# Patient Record
Sex: Female | Born: 1975 | Race: Black or African American | Hispanic: No | Marital: Single | Smoking: Never smoker
Health system: Southern US, Community
[De-identification: ages and names within clinical notes are randomized; demographics above are authoritative.]

## PROBLEM LIST (undated history)

## (undated) DIAGNOSIS — D219 Benign neoplasm of connective and other soft tissue, unspecified: Secondary | ICD-10-CM

## (undated) DIAGNOSIS — K08409 Partial loss of teeth, unspecified cause, unspecified class: Secondary | ICD-10-CM

## (undated) HISTORY — DX: Benign neoplasm of connective and other soft tissue, unspecified: D21.9

## (undated) HISTORY — DX: Partial loss of teeth, unspecified cause, unspecified class: K08.409

---

## 2013-08-07 ENCOUNTER — Emergency Department (HOSPITAL_COMMUNITY)
Admission: EM | Admit: 2013-08-07 | Discharge: 2013-08-07 | Disposition: A | Discharge status: Home / Self Care | Payer: Self-pay | Attending: Emergency Medicine | Admitting: Emergency Medicine

## 2013-08-07 ENCOUNTER — Encounter (HOSPITAL_COMMUNITY): Payer: Self-pay | Admitting: Emergency Medicine

## 2013-08-07 DIAGNOSIS — R51 Headache: Secondary | ICD-10-CM | POA: Insufficient documentation

## 2013-08-07 DIAGNOSIS — H6693 Otitis media, unspecified, bilateral: Secondary | ICD-10-CM

## 2013-08-07 DIAGNOSIS — R05 Cough: Secondary | ICD-10-CM | POA: Insufficient documentation

## 2013-08-07 DIAGNOSIS — R059 Cough, unspecified: Secondary | ICD-10-CM | POA: Insufficient documentation

## 2013-08-07 DIAGNOSIS — H6690 Otitis media, unspecified, unspecified ear: Secondary | ICD-10-CM | POA: Insufficient documentation

## 2013-08-07 MED ORDER — AMOXICILLIN 500 MG PO CAPS
500.0000 mg | ORAL_CAPSULE | Freq: Three times a day (TID) | ORAL | Status: DC
Start: 2013-08-07 — End: 2013-08-24

## 2013-08-07 MED ORDER — ANTIPYRINE-BENZOCAINE 5.4-1.4 % OT SOLN
3.0000 [drp] | OTIC | Status: DC | PRN
  Administered 2013-08-07: 3 [drp] via OTIC
  Filled 2013-08-07: qty 10

## 2013-08-07 NOTE — ED Provider Notes (Signed)
CSN: [id number]     Arrival date & time 08/07/13  1904 History  This chart was scribed for non-physician practitioner, Wille Glaser, PA-C,working with Tanna Furry, MD, by Marlowe Kays, ED Scribe.  This patient was seen in room WTR7/WTR7 and the patient's care was started at 7:32 PM.  Chief Complaint  Patient presents with  . Otalgia   The history is provided by the patient. No language interpreter was used.   HPI Comments:  Christine Lara is a 38 y.o. female who presents to the Emergency Department complaining of flu-like symptoms for approximately 6 days. She reports associated fever, chills, rhinorrhea, mildly productive cough, headache, and right ear otalgia. She states it feels like there may be water in her ear. She states she feels like her flu symptoms were resolving, but states she now feels like she has a cold. She denies body aches, neck stiffness or pain, facial pain and sore throat.  History reviewed. No pertinent past medical history. History reviewed. No pertinent past surgical history. Family History  Problem Relation Age of Onset  . Diabetes Other    History  Substance Use Topics  . Smoking status: Never Smoker   . Smokeless tobacco: Not on file  . Alcohol Use: No   OB History   Grav Para Term Preterm Abortions TAB SAB Ect Mult Living                 Review of Systems  Constitutional: Positive for fever and chills.  HENT: Positive for ear pain (bilateral) and rhinorrhea. Negative for facial swelling and sore throat.   Respiratory: Positive for cough.   Neurological: Positive for headaches.  All other systems reviewed and are negative.    Allergies  Review of patient's allergies indicates no known allergies.  Home Medications   Current Outpatient Rx  Name  Route  Sig  Dispense  Refill  . naproxen (NAPROSYN) 250 MG tablet   Oral   Take 250 mg by mouth 2 (two) times daily as needed (pain).          Triage Vitals: BP 130/83  Pulse 99  Temp(Src)  100.4 F (38 C) (Oral)  Resp 16  Ht 5\' 4"  (1.626 m)  Wt 125 lb (56.7 kg)  BMI 21.45 kg/m2  SpO2 99%  LMP 07/14/2013 Physical Exam  Nursing note and vitals reviewed. Constitutional: She is oriented to person, place, and time. She appears well-developed and well-nourished. No distress.  HENT:  Head: Normocephalic and atraumatic.  Right Ear: Hearing, external ear and ear canal normal. Tympanic membrane is erythematous and bulging.  Left Ear: Hearing and ear canal normal. Tympanic membrane is erythematous and bulging.  Nose: Nose normal.  Mouth/Throat: Oropharynx is clear and moist. No oropharyngeal exudate.  Eyes: Conjunctivae are normal. Pupils are equal, round, and reactive to light. No scleral icterus.  Neck: Normal range of motion. Neck supple.  Cardiovascular: Normal rate, regular rhythm and normal heart sounds.  Exam reveals no gallop and no friction rub.   No murmur heard. Pulmonary/Chest: Effort normal and breath sounds normal. No respiratory distress. She has no wheezes. She has no rales. She exhibits no tenderness.  Abdominal: Soft. Bowel sounds are normal. She exhibits no distension. There is no tenderness.  Musculoskeletal: Normal range of motion. She exhibits no edema and no tenderness.  Lymphadenopathy:    She has no cervical adenopathy.  Neurological: She is alert and oriented to person, place, and time. She exhibits normal muscle tone. Coordination normal.  Skin:  Skin is warm and dry. No rash noted. No erythema. No pallor.  Psychiatric: She has a normal mood and affect. Her behavior is normal. Judgment and thought content normal.    ED Course  Procedures (including critical care time) DIAGNOSTIC STUDIES: Oxygen Saturation is 99% on RA, normal by my interpretation.   COORDINATION OF CARE: 7:37 PM- Will treat for bilateral ear infection with Auralgan otic solution and Amoxicillin. Pt verbalizes understanding and agrees to plan.  Medications - No data to  display  Labs Review Labs Reviewed - No data to display Imaging Review No results found.  EKG Interpretation   None       MDM  Bilateral OM  Patient with bilateral OM on exam, will place on abx and auralgan here for the pain, no evidence of other emergency medical condition including, mastoiditis, hemotypanum.  I personally performed the services described in this documentation, which was scribed in my presence. The recorded information has been reviewed and is accurate.    Idalia Needle Joelyn Oms, [place] 08/07/13 1946

## 2013-08-07 NOTE — ED Notes (Addendum)
Pt states she had flu since last week and felt she was getting better.   Pt diagnosed self and did not seek med treatment.  Using home otc meds.   Today she wakes with an earache.

## 2013-08-07 NOTE — Discharge Instructions (Signed)
Otitis Media, Adult Otitis media is redness, soreness, and swelling (inflammation) of the middle ear. Otitis media may be caused by allergies or, most commonly, by infection. Often it occurs as a complication of the common cold. SIGNS AND SYMPTOMS Symptoms of otitis media may include:  Earache.  Fever.  Ringing in your ear.  Headache.  Leakage of fluid from the ear. DIAGNOSIS To diagnose otitis media, your health care provider will examine your ear with an otoscope. This is an instrument that allows your health care provider to see into your ear in order to examine your eardrum. Your health care provider also will ask you questions about your symptoms. TREATMENT  Typically, otitis media resolves on its own within 3 5 days. Your health care provider may prescribe medicine to ease your symptoms of pain. If otitis media does not resolve within 5 days or is recurrent, your health care provider may prescribe antibiotic medicines if he or she suspects that a bacterial infection is the cause. HOME CARE INSTRUCTIONS   Take your medicine as directed until it is gone, even if you feel better after the first few days.  Only take over-the-counter or prescription medicines for pain, discomfort, or fever as directed by your health care provider.  Follow up with your health care provider as directed. SEEK MEDICAL CARE IF:  You have otitis media only in one ear or bleeding from your nose or both.  You notice a lump on your neck.  You are not getting better in 3 5 days.  You feel worse instead of better. SEEK IMMEDIATE MEDICAL CARE IF:   You have pain that is not controlled with medicine.  You have swelling, redness, or pain around your ear or stiffness in your neck.  You notice that part of your face is paralyzed.  You notice that the bone behind your ear (mastoid) is tender when you touch it. MAKE SURE YOU:   Understand these instructions.  Will watch your condition.  Will get help  right away if you are not doing well or get worse. Document Released: 04/15/2004 Document Revised: 05/01/2013 Document Reviewed: 02/05/2013 [employer] Patient Information 2014 [employer].  

## 2013-08-10 NOTE — ED Provider Notes (Signed)
Medical screening examination/treatment/procedure(s) were performed by non-physician practitioner and as supervising physician I was immediately available for consultation/collaboration.  EKG Interpretation   None         Tanna Furry, MD 08/10/13 [phone]

## 2013-08-24 ENCOUNTER — Encounter (HOSPITAL_COMMUNITY): Payer: Self-pay | Admitting: Emergency Medicine

## 2013-08-24 ENCOUNTER — Emergency Department (INDEPENDENT_AMBULATORY_CARE_PROVIDER_SITE_OTHER)
Admission: EM | Admit: 2013-08-24 | Discharge: 2013-08-24 | Disposition: A | Discharge status: Home / Self Care | Payer: Self-pay | Source: Home / Self Care | Attending: Family Medicine | Admitting: Family Medicine

## 2013-08-24 DIAGNOSIS — H6503 Acute serous otitis media, bilateral: Secondary | ICD-10-CM

## 2013-08-24 DIAGNOSIS — H6500 Acute serous otitis media, unspecified ear: Secondary | ICD-10-CM

## 2013-08-24 MED ORDER — PREDNISONE 10 MG PO TABS: 30.0000 mg | ORAL_TABLET | Freq: Every day | ORAL | Status: DC

## 2013-08-24 NOTE — Discharge Instructions (Signed)
Thank you for coming in today. Take prednisone daily for 5 days. If you're not better in one month please followup in [facility] ear nose and throat.

## 2013-08-24 NOTE — ED Notes (Signed)
Pt c/o bilateral ear pain/discomfort Reports she was seen at [facility] ER and given Amox for infection Finished course of antibiotics and tolerated well.... Reports she's feeling better Denies: f/v/n/d, cold sxs Alert w/no signs of acute distress.

## 2013-08-24 NOTE — ED Provider Notes (Signed)
Christine Lara is a 38 y.o. female who presents to Urgent Care today for ear fullness. Patient was seen in the emergency room January 14 for bilateral otitis media. She was treated with amoxicillin. She notes that her pain has resolved however she continues to experience ear fullness bilaterally. Right is worse than left. No fevers or chills cough congestion nausea vomiting runny nose.   History reviewed. No pertinent past medical history. History  Substance Use Topics  . Smoking status: Never Smoker   . Smokeless tobacco: Not on file  . Alcohol Use: No   ROS as above Medications: No current facility-administered medications for this encounter.   Current Outpatient Prescriptions  Medication Sig Dispense Refill  . naproxen (NAPROSYN) 250 MG tablet Take 250 mg by mouth 2 (two) times daily as needed (pain).      . predniSONE (DELTASONE) 10 MG tablet Take 3 tablets (30 mg total) by mouth daily.  15 tablet  0    Exam:  BP 123/72  Pulse 81  Temp(Src) 98.2 F (36.8 C) (Oral)  Resp 12  SpO2 100%  LMP 08/17/2013 Gen: Well NAD HEENT:  MMM bilateral tympanic membranes with serous effusion without erythema. Posterior pharynx is normal  Assessment and Plan: 38 y.o. female with serous effusion bilaterally. Plan to treat with prednisone.  Discussed warning signs or symptoms. Please see discharge instructions. Patient expresses understanding.    Gregor Hams, MD 08/24/13 [phone]

## 2016-01-28 ENCOUNTER — Other Ambulatory Visit: Payer: Self-pay | Admitting: Family Medicine

## 2016-01-28 DIAGNOSIS — R1032 Left lower quadrant pain: Secondary | ICD-10-CM

## 2016-01-28 DIAGNOSIS — Z1231 Encounter for screening mammogram for malignant neoplasm of breast: Secondary | ICD-10-CM

## 2016-02-03 ENCOUNTER — Ambulatory Visit
Admission: RE | Admit: 2016-02-03 | Discharge: 2016-02-03 | Disposition: A | Discharge status: Home / Self Care | Payer: BLUE CROSS/BLUE SHIELD | Source: Ambulatory Visit | Attending: Family Medicine | Admitting: Family Medicine

## 2016-02-03 DIAGNOSIS — Z1231 Encounter for screening mammogram for malignant neoplasm of breast: Secondary | ICD-10-CM

## 2016-02-08 ENCOUNTER — Other Ambulatory Visit: Payer: Self-pay | Admitting: Family Medicine

## 2016-02-08 ENCOUNTER — Ambulatory Visit
Admission: RE | Admit: 2016-02-08 | Discharge: 2016-02-08 | Disposition: A | Discharge status: Home / Self Care | Payer: BLUE CROSS/BLUE SHIELD | Source: Ambulatory Visit | Attending: Family Medicine | Admitting: Family Medicine

## 2016-02-08 DIAGNOSIS — R1084 Generalized abdominal pain: Secondary | ICD-10-CM

## 2016-02-08 DIAGNOSIS — IMO0001 Reserved for inherently not codable concepts without codable children: Secondary | ICD-10-CM

## 2016-02-08 DIAGNOSIS — R928 Other abnormal and inconclusive findings on diagnostic imaging of breast: Secondary | ICD-10-CM

## 2016-02-08 DIAGNOSIS — R1032 Left lower quadrant pain: Secondary | ICD-10-CM

## 2016-02-17 ENCOUNTER — Ambulatory Visit
Admission: RE | Admit: 2016-02-17 | Discharge: 2016-02-17 | Disposition: A | Discharge status: Home / Self Care | Payer: BLUE CROSS/BLUE SHIELD | Source: Ambulatory Visit | Attending: Family Medicine | Admitting: Family Medicine

## 2016-02-17 ENCOUNTER — Other Ambulatory Visit: Payer: Self-pay | Admitting: Family Medicine

## 2016-02-17 DIAGNOSIS — R928 Other abnormal and inconclusive findings on diagnostic imaging of breast: Secondary | ICD-10-CM

## 2016-02-17 DIAGNOSIS — N6320 Unspecified lump in the left breast, unspecified quadrant: Secondary | ICD-10-CM

## 2016-02-22 ENCOUNTER — Other Ambulatory Visit (HOSPITAL_COMMUNITY): Payer: Self-pay

## 2016-02-22 ENCOUNTER — Other Ambulatory Visit: Payer: Self-pay | Admitting: Family Medicine

## 2016-02-22 DIAGNOSIS — R1084 Generalized abdominal pain: Secondary | ICD-10-CM

## 2016-02-23 ENCOUNTER — Other Ambulatory Visit: Payer: Self-pay | Admitting: Family Medicine

## 2016-02-23 DIAGNOSIS — N6320 Unspecified lump in the left breast, unspecified quadrant: Secondary | ICD-10-CM

## 2016-02-24 ENCOUNTER — Other Ambulatory Visit: Payer: Self-pay | Admitting: Family Medicine

## 2016-02-24 ENCOUNTER — Ambulatory Visit
Admission: RE | Admit: 2016-02-24 | Discharge: 2016-02-24 | Disposition: A | Discharge status: Home / Self Care | Payer: BLUE CROSS/BLUE SHIELD | Source: Ambulatory Visit | Attending: Family Medicine | Admitting: Family Medicine

## 2016-02-24 DIAGNOSIS — N6320 Unspecified lump in the left breast, unspecified quadrant: Secondary | ICD-10-CM

## 2016-02-24 DIAGNOSIS — N6310 Unspecified lump in the right breast, unspecified quadrant: Secondary | ICD-10-CM

## 2016-02-29 ENCOUNTER — Other Ambulatory Visit: Payer: BLUE CROSS/BLUE SHIELD

## 2016-03-23 DIAGNOSIS — D259 Leiomyoma of uterus, unspecified: Secondary | ICD-10-CM | POA: Insufficient documentation

## 2016-07-29 ENCOUNTER — Other Ambulatory Visit: Payer: Self-pay | Admitting: Family Medicine

## 2016-07-29 DIAGNOSIS — N6320 Unspecified lump in the left breast, unspecified quadrant: Secondary | ICD-10-CM

## 2016-08-26 ENCOUNTER — Ambulatory Visit
Admission: RE | Admit: 2016-08-26 | Discharge: 2016-08-26 | Disposition: A | Discharge status: Home / Self Care | Payer: BLUE CROSS/BLUE SHIELD | Source: Ambulatory Visit | Attending: Family Medicine | Admitting: Family Medicine

## 2016-08-26 DIAGNOSIS — N6320 Unspecified lump in the left breast, unspecified quadrant: Secondary | ICD-10-CM

## 2017-02-21 ENCOUNTER — Other Ambulatory Visit: Payer: Self-pay | Admitting: Family Medicine

## 2017-02-21 DIAGNOSIS — N6002 Solitary cyst of left breast: Secondary | ICD-10-CM

## 2017-03-07 ENCOUNTER — Other Ambulatory Visit: Payer: BLUE CROSS/BLUE SHIELD

## 2017-04-04 ENCOUNTER — Other Ambulatory Visit: Payer: BLUE CROSS/BLUE SHIELD

## 2017-04-05 ENCOUNTER — Other Ambulatory Visit: Payer: BLUE CROSS/BLUE SHIELD

## 2017-04-19 ENCOUNTER — Other Ambulatory Visit: Payer: BLUE CROSS/BLUE SHIELD

## 2017-05-05 ENCOUNTER — Other Ambulatory Visit: Payer: BLUE CROSS/BLUE SHIELD

## 2017-06-02 ENCOUNTER — Ambulatory Visit
Admission: RE | Admit: 2017-06-02 | Discharge: 2017-06-02 | Disposition: A | Discharge status: Home / Self Care | Payer: BLUE CROSS/BLUE SHIELD | Source: Ambulatory Visit | Attending: Family Medicine | Admitting: Family Medicine

## 2017-06-02 ENCOUNTER — Other Ambulatory Visit: Payer: Self-pay | Admitting: Family Medicine

## 2017-06-02 DIAGNOSIS — N6002 Solitary cyst of left breast: Secondary | ICD-10-CM

## 2017-10-06 ENCOUNTER — Other Ambulatory Visit: Payer: Self-pay | Admitting: Obstetrics and Gynecology

## 2017-10-06 DIAGNOSIS — R922 Inconclusive mammogram: Secondary | ICD-10-CM

## 2017-10-11 ENCOUNTER — Ambulatory Visit
Admission: RE | Admit: 2017-10-11 | Discharge: 2017-10-11 | Disposition: A | Discharge status: Home / Self Care | Payer: BLUE CROSS/BLUE SHIELD | Source: Ambulatory Visit | Attending: Obstetrics and Gynecology | Admitting: Obstetrics and Gynecology

## 2017-10-11 DIAGNOSIS — R922 Inconclusive mammogram: Secondary | ICD-10-CM

## 2017-11-12 IMAGING — US US ABDOMEN COMPLETE
1 series · 14 of 25 positions shown · non-contrast
Comparison: None in PACs

CLINICAL DATA: Intermittent abdominal discomfort radiating to the
pelvis for several months without nausea vomiting, increased gas.

EXAM:
ABDOMEN ULTRASOUND COMPLETE

[Series 1: us abdomen complete · 0.26mm/px · 14 of 85 slices shown]
[im 1/85]
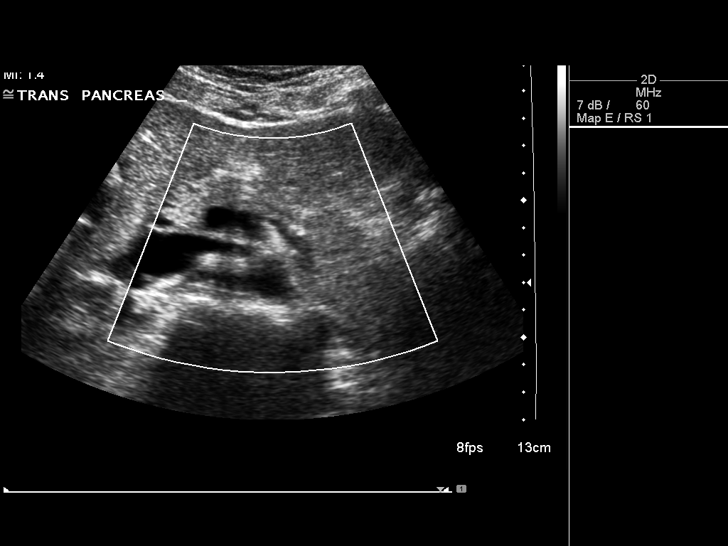
[im 8/85]
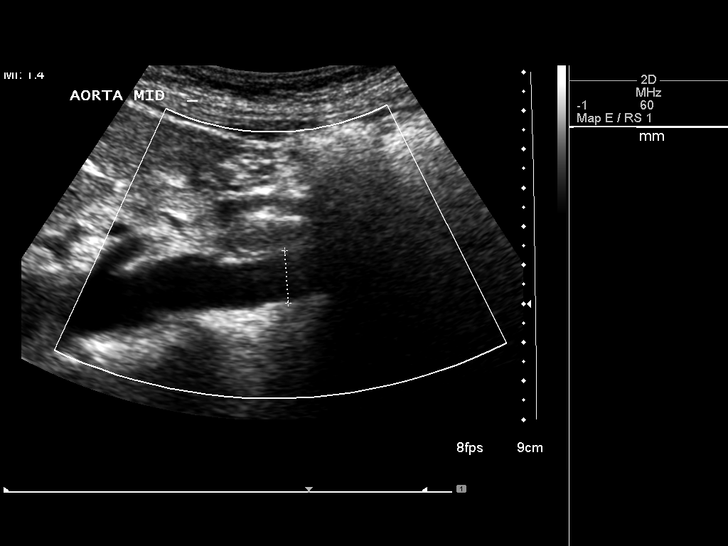
[im 15/85]
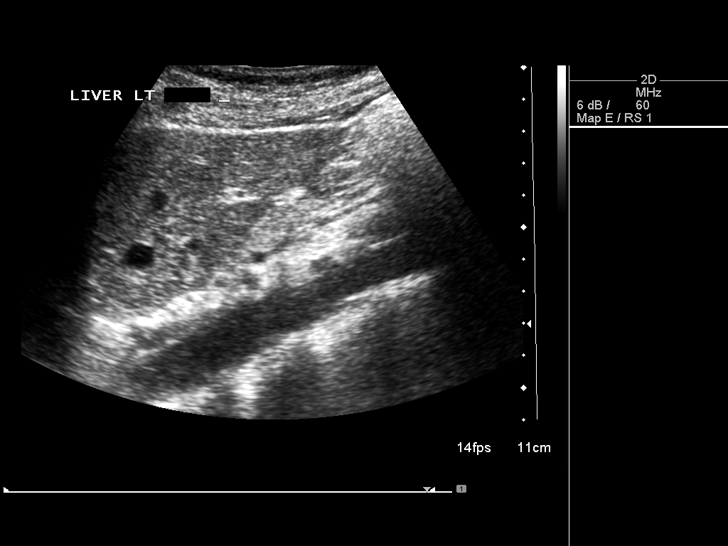
[im 22/85]
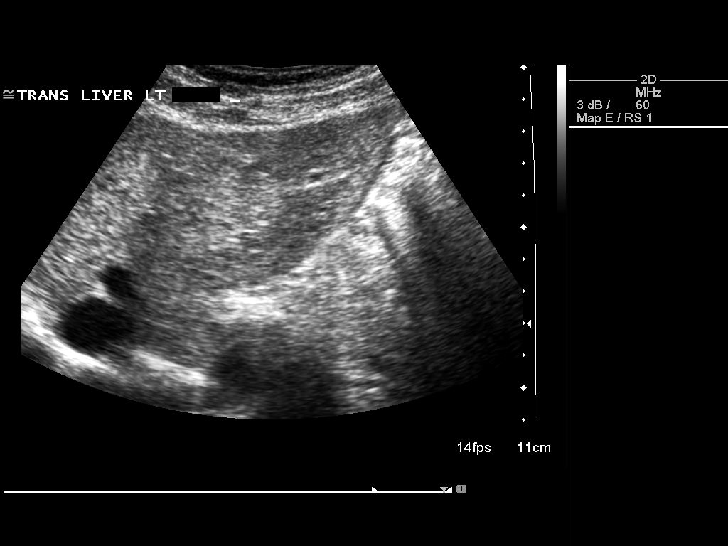
[im 29/85]
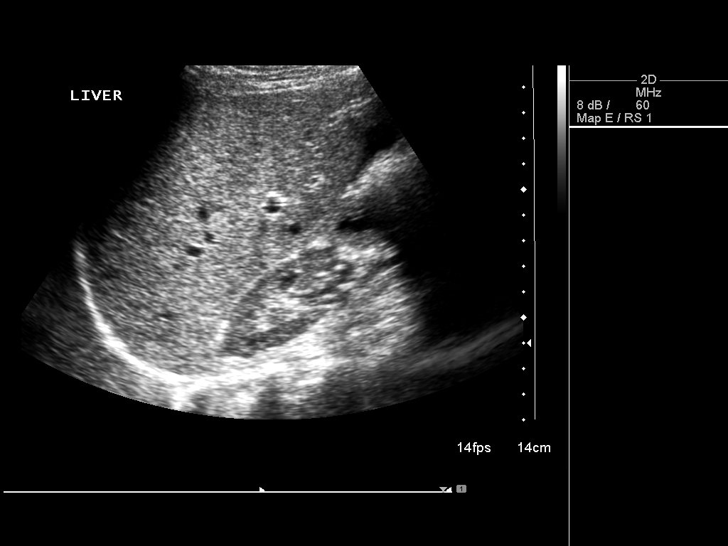
[im 32/85]
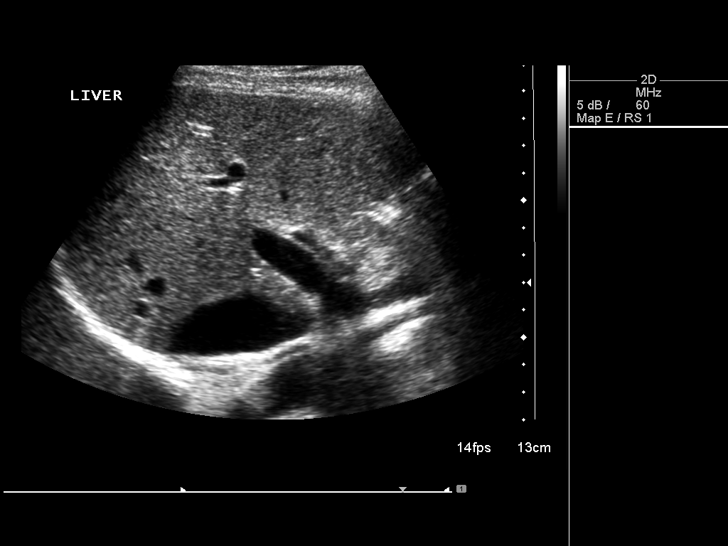
[im 39/85]
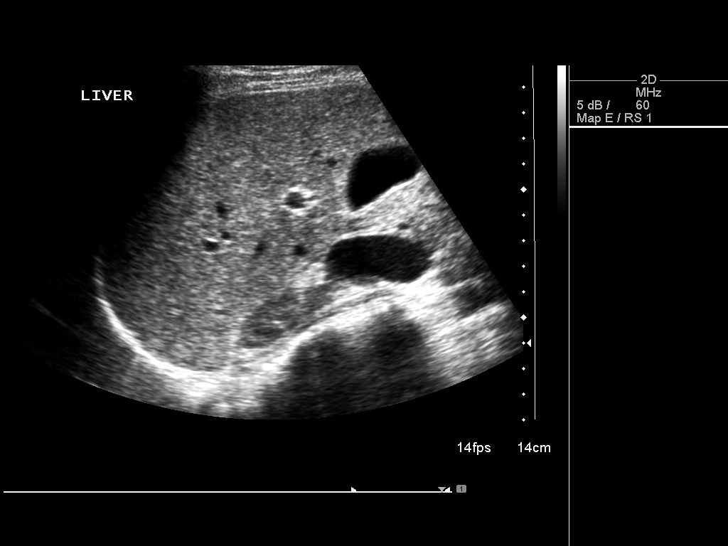
[im 46/85]
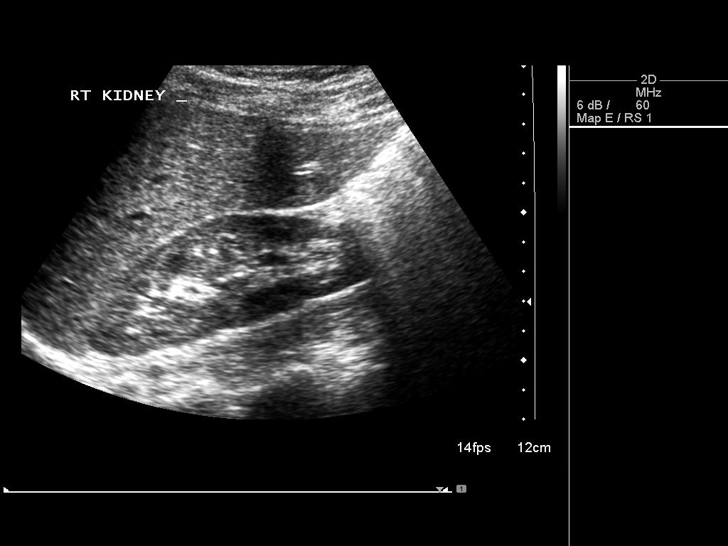
[im 53/85]
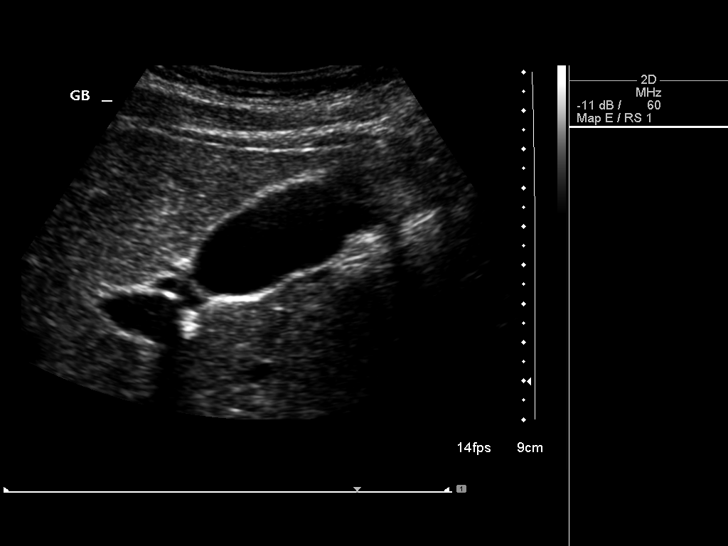
[im 57/85]
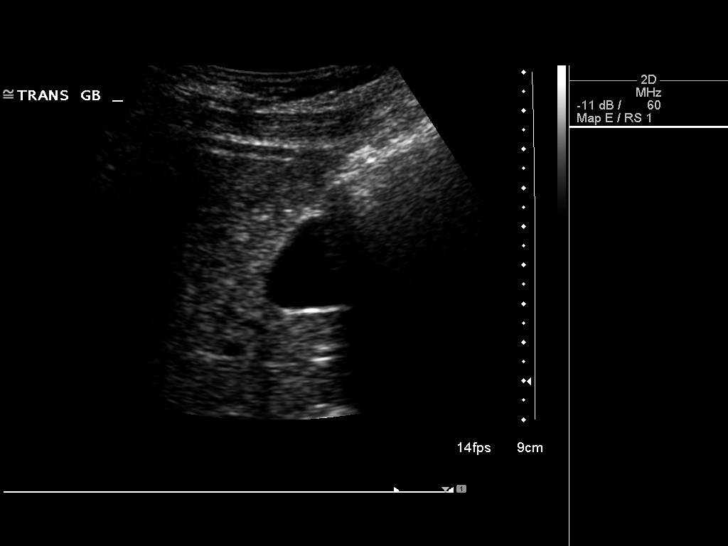
[im 64/85]
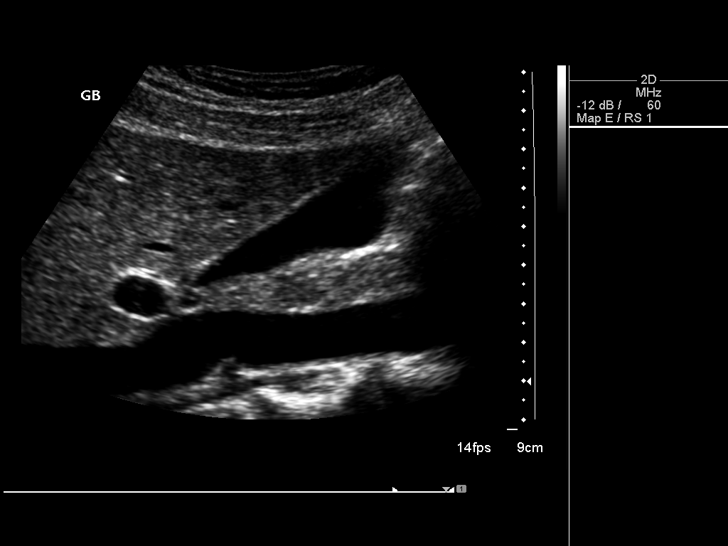
[im 71/85]
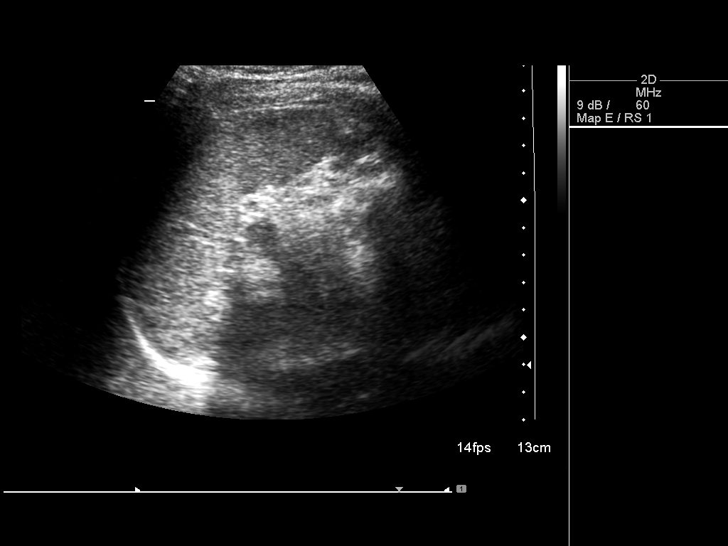
[im 78/85]
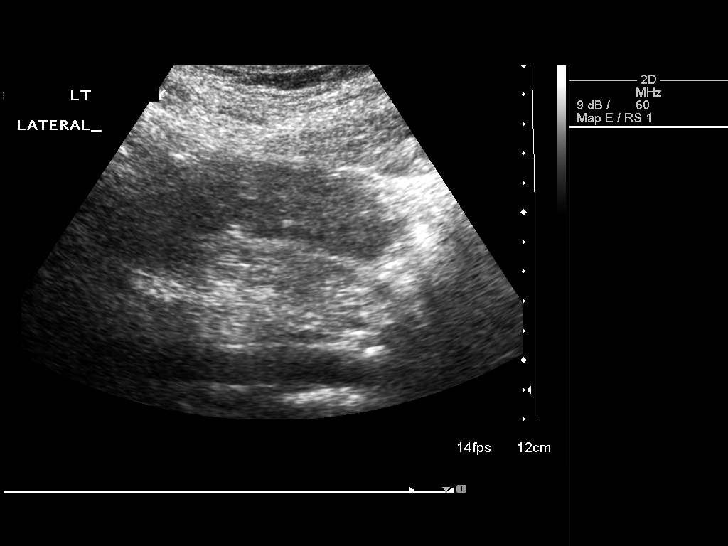
[im 85/85]
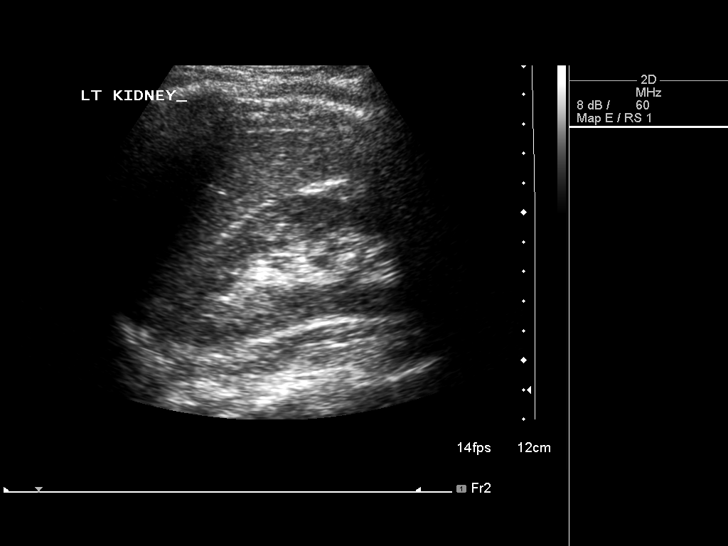

[14 of 25 positions shown; findings below may reference images not displayed]

FINDINGS: Gallbladder: No gallstones or wall thickening visualized. No
sonographic Murphy sign noted by sonographer.

Common bile duct: Diameter: 4.7 mm

Liver: No focal lesion identified. Within normal limits in
parenchymal echogenicity.

IVC: Bowel gas limits evaluation of the lower IVC.

Pancreas: Limited visualization of portions of the pancreatic tail.
The pancreatic head and body are grossly normal.

Spleen: Size and appearance within normal limits.

Right Kidney: Length: 10.4 cm. Echogenicity within normal limits. No
mass or hydronephrosis visualized.

Left Kidney: Length: 10.6 cm. Echogenicity within normal limits. No
mass or hydronephrosis visualized.

Abdominal aorta: No aneurysm visualized.

Other findings: No ascites.
IMPRESSION: No gallstones nor other evidence of gallbladder pathology. If there
are clinical concerns of gallbladder dysfunction, a nuclear medicine
hepatobiliary scan may be useful.

No acute abnormality observed within the abdomen.

## 2017-12-06 ENCOUNTER — Encounter: Payer: Self-pay | Admitting: Obstetrics & Gynecology

## 2018-02-23 ENCOUNTER — Encounter: Payer: Self-pay | Admitting: Obstetrics & Gynecology

## 2018-03-12 ENCOUNTER — Telehealth: Payer: Self-pay | Admitting: *Deleted

## 2018-03-12 NOTE — Telephone Encounter (Signed)
Patient called asking if her chart from previous OB/GYN has arrived, new patient appointment scheduled on 03/21/18. I told her I did not see any new records. Patient will have previous GYN office sent records.

## 2018-03-21 ENCOUNTER — Ambulatory Visit: Payer: BLUE CROSS/BLUE SHIELD | Admitting: Obstetrics & Gynecology

## 2018-03-21 ENCOUNTER — Encounter: Payer: Self-pay | Admitting: Obstetrics & Gynecology

## 2018-03-21 VITALS — BP 126/82 | Ht 65.0 in | Wt 145.0 lb

## 2018-03-21 DIAGNOSIS — N920 Excessive and frequent menstruation with regular cycle: Secondary | ICD-10-CM | POA: Diagnosis not present

## 2018-03-21 DIAGNOSIS — D500 Iron deficiency anemia secondary to blood loss (chronic): Secondary | ICD-10-CM | POA: Diagnosis not present

## 2018-03-21 DIAGNOSIS — Z7251 High risk heterosexual behavior: Secondary | ICD-10-CM

## 2018-03-21 DIAGNOSIS — D219 Benign neoplasm of connective and other soft tissue, unspecified: Secondary | ICD-10-CM | POA: Diagnosis not present

## 2018-03-21 LAB — CBC
HCT: 30.1 % — ABNORMAL LOW (ref 35.0–45.0)
Hemoglobin: 8.9 g/dL — ABNORMAL LOW (ref 11.7–15.5)
MCH: 21.3 pg — ABNORMAL LOW (ref 27.0–33.0)
MCHC: 29.6 g/dL — ABNORMAL LOW (ref 32.0–36.0)
MCV: 72 fL — ABNORMAL LOW (ref 80.0–100.0)
MPV: 9.6 fL (ref 7.5–12.5)
Platelets: 421 10*3/uL — ABNORMAL HIGH (ref 140–400)
RBC: 4.18 Million/uL (ref 3.80–5.10)
RDW: 14.5 % (ref 11.0–15.0)
WBC: 3.7 10*3/uL — ABNORMAL LOW (ref 3.8–10.8)

## 2018-03-21 NOTE — Progress Notes (Signed)
Christine Lara 07-29-1975 [id number]        42 y.o.  [id number] (given to adoption)  Stable boyfriend.    RP: Menorrhagia/Fibroids/Anemia  HPI: Long-standing heavy menstrual flow associated with uterine fibroids.  Patient was started on birth control pill with Dorothy Puffer a year and a half ago without any improvement on her heavy periods.  She also complains of pelvic discomfort and pain.  She has had anemia in the past and is therefore taking iron supplements.  Her last hemoglobin on February 23, 2018 was at 9.1.  Per pelvic ultrasound the fibroids were relatively stable between 2017 and 2019.  On the last pelvic ultrasound May 2019 the largest fibroid measured was 4.91 to the smallest at 1.92 cm and 11 of them were measured.  The fibroids were intramural and subserosal.  The overall uterine volume was measured at 407.9 cm3, largest diameter at 11.9 cm.  May want to preserve fertility.   OB History  Gravida Para Term Preterm AB Living  3 1     2 1   SAB TAB Ectopic Multiple Live Births               # Outcome Date GA Lbr Len/2nd Weight Sex Delivery Anes PTL Lv  3 AB           2 AB           1 Para             Past medical history,surgical history, problem list, medications, allergies, family history and social history were all reviewed and documented in the EPIC chart.   Directed ROS with pertinent positives and negatives documented in the history of present illness/assessment and plan.  Exam:  Vitals:   03/21/18 1115  BP: 126/82  Weight: 145 lb (65.8 kg)  Height: 5\' 5"  (1.651 m)   General appearance:  Normal  Abdomen: Normal  Gynecologic exam: Vulva normal.  Bimanual exam: Uterus and large and nodular about 12 cm in diameter, mobile, nontender.  No adnexal mass felt, nontender bilaterally.  Pelvic US 11/2017: Largest fibroid measured was 4.91 to the smallest at 1.92 cm and 11 of them were measured.  The fibroids were intramural and subserosal.  The overall uterine volume was  measured at 407.9 cm3, largest diameter at 11.9 cm.   Assessment/Plan:  42 y.o. [id number]   1. Fibroids Many intramural and subserosal uterine fibroids with severe menorrhagia and secondary anemia.  Will verify the CBC today to recheck the hemoglobin.  Menorrhagia not improved on birth control pills.  Patient may want to preserve her fertility although she is 41 years old.  Hormonal and medical ways to control her heavy periods and surgical approaches thoroughly reviewed with patient.  Depo-Lupron recommended to shrink the fibroids and control the heavy bleeding.  Usage, risks and benefits reviewed with patient.  Will give a 68-month dose x2.  May need IV iron in the meantime.  2. Menorrhagia with regular cycle No improvement on birth control pills. - CBC  3. Iron deficiency anemia due to chronic blood loss Iron rich nutrition and iron supplement recommended.  We will recheck hemoglobin today. - CBC  4. Unprotected sexual intercourse Patient was on birth control pill when had intercourse without condoms.  Wanted to verify a serum pregnancy test because did not have a withdrawal bleeding on the pill. - hCG, serum, qualitative  Counseling on above issues and coordination of care more than 50% for 45 minutes.  Princess Bruins MD,  11:29 AM 03/21/2018

## 2018-03-24 ENCOUNTER — Encounter: Payer: Self-pay | Admitting: Obstetrics & Gynecology

## 2018-03-24 NOTE — Patient Instructions (Signed)
1. Fibroids Many intramural and subserosal uterine fibroids with severe menorrhagia and secondary anemia.  Will verify the CBC today to recheck the hemoglobin.  Menorrhagia not improved on birth control pills.  Patient may want to preserve her fertility although she is 42 years old.  Hormonal and medical ways to control her heavy periods and surgical approaches thoroughly reviewed with patient.  Depo-Lupron recommended to shrink the fibroids and control the heavy bleeding.  Usage, risks and benefits reviewed with patient.  Will give a 62-month dose x2.  May need IV iron in the meantime.  2. Menorrhagia with regular cycle No improvement on birth control pills. - CBC  3. Iron deficiency anemia due to chronic blood loss Iron rich nutrition and iron supplement recommended.  We will recheck hemoglobin today. - CBC  4. Unprotected sexual intercourse Patient was on birth control pill when had intercourse without condoms.  Wanted to verify a serum pregnancy test because did not have a withdrawal bleeding on the pill. - hCG, serum, qualitative

## 2018-03-27 ENCOUNTER — Telehealth: Payer: Self-pay

## 2018-03-27 NOTE — Telephone Encounter (Signed)
-----   Message from Princess Bruins, MD sent at 03/23/2018  9:32 AM EDT ----- Hb 8.9.  Recommend IV Iron, Feraheme.  Needs to decide on management, could start with DepoLupron if not ready for Hysterectomy.

## 2018-03-27 NOTE — Telephone Encounter (Signed)
Discussed CBC result with patient. Patient said she wanted to check with her insurance before I schedule Feraheme IV infusion to see how they cover it.  She said she had already checked with her insurance co. About Lupron and has a big deductible. I told her if she decides she wants to do it when I send order to [facility] oftentimes they can offer a discount coupon if big deductible. May not be a great help but she could see.  She wants to check on iron cost and think about Lupron and she said she will get back and let me know.  Of note, Qual HCG was not drawn so no result for her. Patient was advised. Patient asked if she could check UPT but then said she thought she'd rather do the Qual PT so will call me for lab appointment when she can come and have it drawn.  Also, of note. Patient said he had no period in July and in August only 4 days of light bleeding with menses. Prior to that menorrhagia with menses every month.

## 2018-03-27 NOTE — Telephone Encounter (Signed)
Not needed

## 2018-04-13 ENCOUNTER — Telehealth: Payer: Self-pay | Admitting: *Deleted

## 2018-04-13 NOTE — Telephone Encounter (Signed)
Patient called back ready to schedule iron infusion, scheduled on 04/18/18 @ 10:00am at [facility] [address]. Patient said she is not ready to proceed with depo Lupron at the time. I left message for patient to call.

## 2018-04-16 NOTE — Telephone Encounter (Signed)
Patient informed with the below note.  

## 2018-04-17 ENCOUNTER — Other Ambulatory Visit (HOSPITAL_COMMUNITY): Payer: Self-pay

## 2018-04-18 ENCOUNTER — Encounter (HOSPITAL_COMMUNITY): Payer: BLUE CROSS/BLUE SHIELD

## 2018-04-25 ENCOUNTER — Other Ambulatory Visit: Payer: Self-pay | Admitting: Obstetrics & Gynecology

## 2018-04-25 ENCOUNTER — Ambulatory Visit (HOSPITAL_COMMUNITY)
Admission: RE | Admit: 2018-04-25 | Discharge: 2018-04-25 | Disposition: A | Discharge status: Home / Self Care | Payer: BLUE CROSS/BLUE SHIELD | Source: Ambulatory Visit | Attending: Obstetrics & Gynecology | Admitting: Obstetrics & Gynecology

## 2018-04-25 DIAGNOSIS — D509 Iron deficiency anemia, unspecified: Secondary | ICD-10-CM | POA: Diagnosis not present

## 2018-04-25 DIAGNOSIS — Z1231 Encounter for screening mammogram for malignant neoplasm of breast: Secondary | ICD-10-CM

## 2018-04-25 MED ORDER — FERUMOXYTOL INJECTION 510 MG/17 ML
510.0000 mg | Freq: Once | INTRAVENOUS | Status: AC
  Administered 2018-04-25: 510 mg via INTRAVENOUS
  Filled 2018-04-25: qty 17

## 2018-04-25 NOTE — Progress Notes (Signed)
Pt tolerated infusion well.  Stated it seemed like she was losing her voice but she didn't think it had anything to do with the infusion.  Pt denied itchy swelling throat or any other symptoms.  DC home without complaint.

## 2018-04-25 NOTE — Discharge Instructions (Signed)

## 2018-05-10 ENCOUNTER — Ambulatory Visit: Payer: BLUE CROSS/BLUE SHIELD | Admitting: Obstetrics & Gynecology

## 2018-05-23 ENCOUNTER — Encounter: Payer: Self-pay | Admitting: Obstetrics & Gynecology

## 2018-05-23 ENCOUNTER — Ambulatory Visit: Payer: BLUE CROSS/BLUE SHIELD | Admitting: Obstetrics & Gynecology

## 2018-05-23 VITALS — BP 126/74

## 2018-05-23 DIAGNOSIS — N920 Excessive and frequent menstruation with regular cycle: Secondary | ICD-10-CM

## 2018-05-23 DIAGNOSIS — D219 Benign neoplasm of connective and other soft tissue, unspecified: Secondary | ICD-10-CM

## 2018-05-23 DIAGNOSIS — D500 Iron deficiency anemia secondary to blood loss (chronic): Secondary | ICD-10-CM

## 2018-05-23 NOTE — Progress Notes (Signed)
    Christine Lara Mar 10, 1976 [id number]        42 y.o.  [id number] Stable boyfriend  RP: Symptomatic Fibroids with Menorrhagia causing anemia  HPI: Received IV Iron about 3 weeks ago.  Continued heavy menses on BCPs.  Continued pelvic discomfort and pain.  On the last pelvic ultrasound in May 2019, 11 fibroids were measured with the largest at 4.91 cm, the overall uterine volume was at 407.9 cc.  Latest hemoglobin done here was 8.9 on March 21, 2018.   OB History  Gravida Para Term Preterm AB Living  3 1     2 1   SAB TAB Ectopic Multiple Live Births               # Outcome Date GA Lbr Len/2nd Weight Sex Delivery Anes PTL Lv  3 AB           2 AB           1 Para             Past medical history,surgical history, problem list, medications, allergies, family history and social history were all reviewed and documented in the EPIC chart.   Directed ROS with pertinent positives and negatives documented in the history of present illness/assessment and plan.  Exam:  Vitals:   05/23/18 1223  BP: 126/74   General appearance:  Normal  Gynecologic exam deferred  Pelvic ultrasound in May 2019, 11 fibroids were measured with the largest at 4.91 cm, the overall uterine volume was at 407.9 cc.  Latest hemoglobin done here was 8.9 on March 21, 2018.   Assessment/Plan:  42 y.o. [id number]   1. Fibroids Multiple uterine fibroids with menorrhagia and secondary anemia.  Desire to preserve her uterus for fertility although she is 42 year old.  In that context, decision to start on DepoLupron 11.25 (3 month dose) x 2.  Usage, risks and benefits of Depo-Lupron treatment reviewed with patient thoroughly.  Patient informed of the fact that Depo-Lupron will put her in a state of very low estrogen and progesterone which will likely cause vasomotor menopausal symptoms during the treatment.  Patient informed that this is completely reversible after the treatment.  We will repeat a pelvic ultrasound to  assess the efficacy of the treatment at 6 months. - US Transvaginal Non-OB; Future in 6 months  2. Menorrhagia with regular cycle Recheck hemoglobin today after RN transfusion.  Serum pregnancy test done at patient's request. - CBC - hCG, serum, qualitative  3. Iron deficiency anemia due to chronic blood loss - CBC  Counseling on above issues and coordination of care more than 50% for 25 minutes.  Princess Bruins MD, 12:38 PM 05/23/2018

## 2018-05-24 LAB — CBC
HEMATOCRIT: 35.2 % (ref 35.0–45.0)
Hemoglobin: 10.7 g/dL — ABNORMAL LOW (ref 11.7–15.5)
MCH: 23.3 pg — ABNORMAL LOW (ref 27.0–33.0)
MCHC: 30.4 g/dL — ABNORMAL LOW (ref 32.0–36.0)
MCV: 76.5 fL — ABNORMAL LOW (ref 80.0–100.0)
MPV: 9.7 fL (ref 7.5–12.5)
PLATELETS: 297 10*3/uL (ref 140–400)
RBC: 4.6 10*6/uL (ref 3.80–5.10)
RDW: 24 % — AB (ref 11.0–15.0)
WBC: 4.1 10*3/uL (ref 3.8–10.8)

## 2018-05-24 LAB — HCG, SERUM, QUALITATIVE: Preg, Serum: NEGATIVE

## 2018-05-25 ENCOUNTER — Encounter: Payer: Self-pay | Admitting: Obstetrics & Gynecology

## 2018-05-25 NOTE — Patient Instructions (Signed)
1. Fibroids Multiple uterine fibroids with menorrhagia and secondary anemia.  Desire to preserve her uterus for fertility although she is 42 year old.  In that context, decision to start on DepoLupron 11.25 (3 month dose) x 2.  Usage, risks and benefits of Depo-Lupron treatment reviewed with patient thoroughly.  Patient informed of the fact that Depo-Lupron will put her in a state of very low estrogen and progesterone which will likely cause vasomotor menopausal symptoms during the treatment.  Patient informed that this is completely reversible after the treatment.  We will repeat a pelvic ultrasound to assess the efficacy of the treatment at 6 months. - US Transvaginal Non-OB; Future in 6 months  2. Menorrhagia with regular cycle Recheck hemoglobin today after RN transfusion.  Serum pregnancy test done at patient's request. - CBC - hCG, serum, qualitative  3. Iron deficiency anemia due to chronic blood loss - CBC  Jaymes, it was a pleasure seeing you today!  I will inform you of your results as soon as they are available.

## 2018-05-31 ENCOUNTER — Telehealth: Payer: Self-pay

## 2018-05-31 NOTE — Telephone Encounter (Signed)
Patient called me stating some company had called her about the Lupron order and the man told her they had sent me her benefits information. I told her that they did send me that as courtesy but someone is supposed to go over it with her prior to her giving okay to ship Rx.  I read her what it said. She said pharmacy had already called her and she will call them back and verify cost, etc.

## 2018-06-05 ENCOUNTER — Telehealth: Payer: Self-pay

## 2018-06-05 NOTE — Telephone Encounter (Signed)
I called patient and left a message that her Lupron has arrived and asked her to call me so that I can schedule her for a nurse appointment to come and receive the injection.

## 2018-06-06 ENCOUNTER — Ambulatory Visit
Admission: RE | Admit: 2018-06-06 | Discharge: 2018-06-06 | Disposition: A | Discharge status: Home / Self Care | Payer: BLUE CROSS/BLUE SHIELD | Source: Ambulatory Visit | Attending: Obstetrics & Gynecology | Admitting: Obstetrics & Gynecology

## 2018-06-06 DIAGNOSIS — Z1231 Encounter for screening mammogram for malignant neoplasm of breast: Secondary | ICD-10-CM

## 2018-06-12 NOTE — Telephone Encounter (Signed)
Patient called in voice mail stating she was ready to schedule Lupron inj. I called back and got her voice mail. Told her hours for injection and asked her to call me back and let me know what day/time and I will schedule a nurse appointment for her.

## 2018-06-12 NOTE — Telephone Encounter (Signed)
Patient called and said she would like to schedule next Weds at 9:30am. Appt scheduled and I called her and spoke with her and confirmed it.

## 2018-06-20 ENCOUNTER — Ambulatory Visit (INDEPENDENT_AMBULATORY_CARE_PROVIDER_SITE_OTHER): Payer: BLUE CROSS/BLUE SHIELD | Admitting: Anesthesiology

## 2018-06-20 DIAGNOSIS — D219 Benign neoplasm of connective and other soft tissue, unspecified: Secondary | ICD-10-CM

## 2018-06-20 MED ORDER — LEUPROLIDE ACETATE (3 MONTH) 11.25 MG IM KIT
11.2500 mg | PACK | Freq: Once | INTRAMUSCULAR | Status: AC
  Administered 2018-06-20: 11.25 mg via INTRAMUSCULAR

## 2018-06-25 ENCOUNTER — Telehealth: Payer: Self-pay | Admitting: *Deleted

## 2018-06-25 NOTE — Telephone Encounter (Signed)
Patient called received depo Lupron on 06/20/18 states cycle started on 06/21/18 states you told this could happen. Patient reports bleeding varies from light bleeding to heavy bleeding only changed pad once today, but when she has a heavy flow she changes pad,  does report big blood clots. Wanted to make sure this is okay and also wanted to know if she should stop the birth control pills? Please advise

## 2018-06-26 NOTE — Telephone Encounter (Signed)
Yes normal, may bleed x 2 weeks.  Better to stop BCPs to avoid stimulation, but recommend condoms for contraception as Lupron is not considered a contraceptive.

## 2018-06-26 NOTE — Telephone Encounter (Signed)
Left detailed message on cell per DPR access. 

## 2018-09-17 ENCOUNTER — Telehealth: Payer: Self-pay

## 2018-09-17 NOTE — Telephone Encounter (Signed)
Pharmacy called to arrange shipment for 2nd dose of Lupron 11.25mg  ordered by Dr.ML. Verified info. Will ship tomorrow.

## 2018-09-18 ENCOUNTER — Telehealth: Payer: Self-pay

## 2018-09-18 NOTE — Telephone Encounter (Signed)
Pharmacy called to say that they could not ship Lupron today because they did not have it. They ordered it and should have it today. Rescheduled delivery with rep. And should ship tomorrow and we should have it tomorrow.

## 2018-09-19 ENCOUNTER — Telehealth: Payer: Self-pay

## 2018-09-19 NOTE — Telephone Encounter (Signed)
I received a call in voice mail stating the Lupron is back in stock and they need me to call and arrange shipment. I called and spoke with Margette Fast. I did let him know that this is 3 days in a row I have scheduled shipment.  He said the med is in stock now and will arrive tomorrow.

## 2018-09-20 ENCOUNTER — Telehealth: Payer: Self-pay | Admitting: *Deleted

## 2018-09-20 ENCOUNTER — Telehealth: Payer: Self-pay

## 2018-09-20 NOTE — Telephone Encounter (Signed)
Pharmacy called to say that the medication was addressed incorrectly to patient and was refused by our office. She has reached out to [employer] and hopefully they will redeliver today but if not should be delivered tomorrow.

## 2018-09-20 NOTE — Telephone Encounter (Signed)
Left message for pt to return my call  Lupron has arrived  Pt needs to call and schedule appt

## 2018-09-24 ENCOUNTER — Encounter: Payer: Self-pay | Admitting: Anesthesiology

## 2018-09-26 ENCOUNTER — Ambulatory Visit: Payer: BLUE CROSS/BLUE SHIELD

## 2018-09-27 ENCOUNTER — Ambulatory Visit (INDEPENDENT_AMBULATORY_CARE_PROVIDER_SITE_OTHER): Payer: BLUE CROSS/BLUE SHIELD | Admitting: Anesthesiology

## 2018-09-27 DIAGNOSIS — D219 Benign neoplasm of connective and other soft tissue, unspecified: Secondary | ICD-10-CM

## 2018-09-27 MED ORDER — LEUPROLIDE ACETATE (3 MONTH) 11.25 MG IM KIT
11.2500 mg | PACK | Freq: Once | INTRAMUSCULAR | Status: AC
  Administered 2018-09-27: 11.25 mg via INTRAMUSCULAR

## 2018-10-17 NOTE — Telephone Encounter (Signed)
Pt called and scheduled appt.

## 2018-12-12 ENCOUNTER — Telehealth: Payer: Self-pay

## 2018-12-12 NOTE — Telephone Encounter (Signed)
I called patient because she has finished two cycles of Lupron 11.25mg  and you recommended she have an u/s at end of April and I wanted to get her scheduled.  She informed me that her new insurance will only pay for her to see [facility] providers and she cannot come see you anymore.  She said she did have a few questions.    1.  When to expect menses after completing 2 rounds of Lupron?  2. When we she start back on bcp's?  She uses them for birth control and to help bleeding.

## 2018-12-13 NOTE — Telephone Encounter (Signed)
I do not see where she had been on birth control pills previously with you.  Do you want to prescribe for her in light of her not going to be a patient here anymore or have her wait and new gyn to prescribe?

## 2018-12-13 NOTE — Telephone Encounter (Signed)
Patient informed. 

## 2018-12-13 NOTE — Telephone Encounter (Signed)
DepoLupron is for 3 months.  So menses could restart at 4 months or slightly later after the injection.  Needs to start BCPs asap.

## 2018-12-13 NOTE — Telephone Encounter (Signed)
She needs to schedule with new Gynecologist, get evaluated there and prescribed the pill at that visit.

## 2018-12-19 ENCOUNTER — Other Ambulatory Visit: Payer: BLUE CROSS/BLUE SHIELD

## 2018-12-19 ENCOUNTER — Ambulatory Visit: Payer: BLUE CROSS/BLUE SHIELD | Admitting: Obstetrics & Gynecology

## 2018-12-20 ENCOUNTER — Other Ambulatory Visit: Payer: BLUE CROSS/BLUE SHIELD

## 2018-12-20 ENCOUNTER — Ambulatory Visit: Payer: BLUE CROSS/BLUE SHIELD | Admitting: Obstetrics & Gynecology

## 2019-02-21 ENCOUNTER — Ambulatory Visit: Payer: BLUE CROSS/BLUE SHIELD | Admitting: Family Medicine

## 2019-02-21 ENCOUNTER — Other Ambulatory Visit: Payer: Self-pay

## 2019-02-21 ENCOUNTER — Encounter: Payer: Self-pay | Admitting: Family Medicine

## 2019-02-21 VITALS — BP 108/72 | HR 78 | Temp 98.5°F | Ht 64.0 in | Wt 150.4 lb

## 2019-02-21 DIAGNOSIS — D219 Benign neoplasm of connective and other soft tissue, unspecified: Secondary | ICD-10-CM

## 2019-02-21 DIAGNOSIS — D251 Intramural leiomyoma of uterus: Secondary | ICD-10-CM | POA: Diagnosis not present

## 2019-02-21 DIAGNOSIS — D250 Submucous leiomyoma of uterus: Secondary | ICD-10-CM | POA: Diagnosis not present

## 2019-02-21 NOTE — Progress Notes (Signed)
   Subjective:    Patient ID: Christine Lara is a 43 y.o. female presenting with Fibroids  on 02/21/2019  HPI: New patient, self referred. Previously seen by Dr. Melba Coon and Dr. Dellis Filbert for eval of her fibroids. Has had some growth. Been on OC's with no resolution. Most recently received DepoLupron 11/19 and 3/20. Insurance no longer covered Dr. Dellis Filbert and she is here for re-eval following Lupron. Did go through menopausal symptoms. She is spotting, but bleeding is improved. Has not had return of normal menses. Wants u/s to check her fibroids. No previous one in system , done while still with Dr. Melba Coon. She is unsure about preservation of her fertility. States she has multiple fibroid, which caused her pain and bleeding. Has FH of fibroids.  Review of Systems  Constitutional: Negative for chills and fever.  Respiratory: Negative for shortness of breath.   Cardiovascular: Negative for chest pain.  Gastrointestinal: Negative for abdominal pain, nausea and vomiting.  Genitourinary: Negative for dysuria.  Skin: Negative for rash.      Objective:    BP 108/72   Pulse 78   Temp 98.5 F (36.9 C)   Ht 5\' 4"  (1.626 m)   Wt 150 lb 6.4 oz (68.2 kg)   LMP  (LMP Unknown)   BMI 25.82 kg/m  Physical Exam Constitutional:      General: She is not in acute distress.    Appearance: She is well-developed.  HENT:     Head: Normocephalic and atraumatic.  Eyes:     General: No scleral icterus. Neck:     Musculoskeletal: Neck supple.  Cardiovascular:     Rate and Rhythm: Normal rate.  Pulmonary:     Effort: Pulmonary effort is normal.  Abdominal:     Palpations: Abdomen is soft. There is no mass.  Skin:    General: Skin is warm and dry.  Neurological:     Mental Status: She is alert and oriented to person, place, and time.         Assessment & Plan:   Problem List Items Addressed This Visit      Unprioritized   Uterine leiomyoma    I cannot feel her uterus on abdominal exam, though  I do not have records to see how big it was previously She asks about resumption of OC's, though she does not want to go on them. We will check u/s, obtain records from previous provider and she will continue to deliberate on her desires for treatment/fertility.       Other Visit Diagnoses    Fibroid    -  Primary   Relevant Orders   US PELVIC COMPLETE WITH TRANSVAGINAL      Total face-to-face time with patient: 20 minutes. Over 50% of encounter was spent on counseling and coordination of care. Return in about 4 weeks (around 03/21/2019) for in person.  Donnamae Jude 02/21/2019 2:05 PM

## 2019-02-22 ENCOUNTER — Encounter: Payer: Self-pay | Admitting: Family Medicine

## 2019-02-22 NOTE — Assessment & Plan Note (Signed)
I cannot feel her uterus on abdominal exam, though I do not have records to see how big it was previously She asks about resumption of OC's, though she does not want to go on them. We will check u/s, obtain records from previous provider and she will continue to deliberate on her desires for treatment/fertility.

## 2019-02-25 ENCOUNTER — Encounter: Payer: Self-pay | Admitting: *Deleted

## 2019-03-07 ENCOUNTER — Ambulatory Visit (HOSPITAL_COMMUNITY): Payer: BLUE CROSS/BLUE SHIELD

## 2019-03-21 ENCOUNTER — Ambulatory Visit: Payer: BLUE CROSS/BLUE SHIELD | Admitting: Family Medicine

## 2020-03-10 IMAGING — MG DIGITAL SCREENING BILATERAL MAMMOGRAM WITH TOMO AND CAD
8 series · 8 of 24 positions shown · non-contrast
Comparison: Previous exam(s).

CLINICAL DATA: Screening.

EXAM:
DIGITAL SCREENING BILATERAL MAMMOGRAM WITH TOMO AND CAD

[R CC synth-2D]
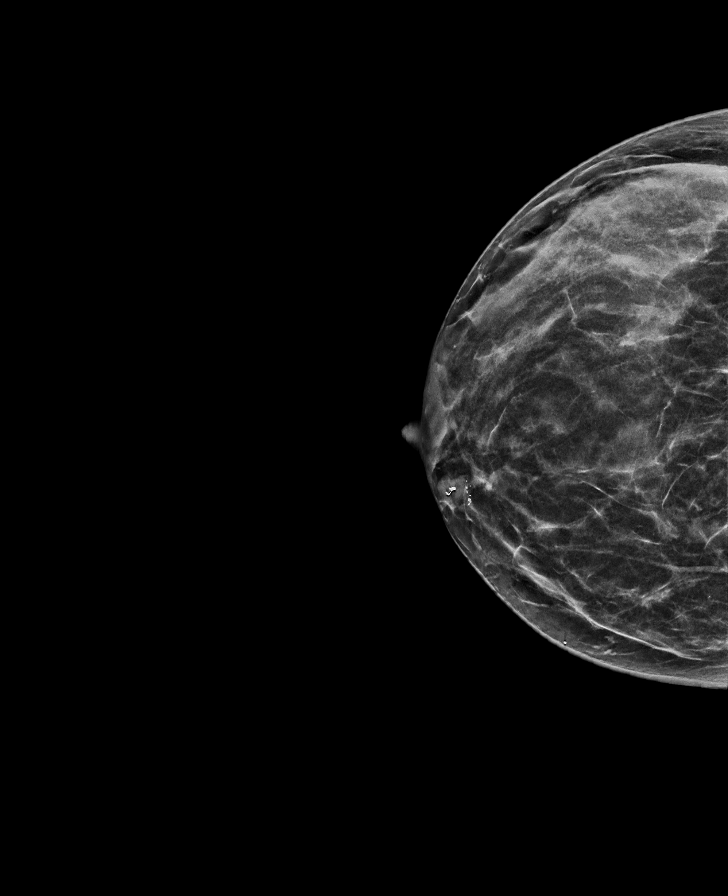

[R MLO synth-2D]
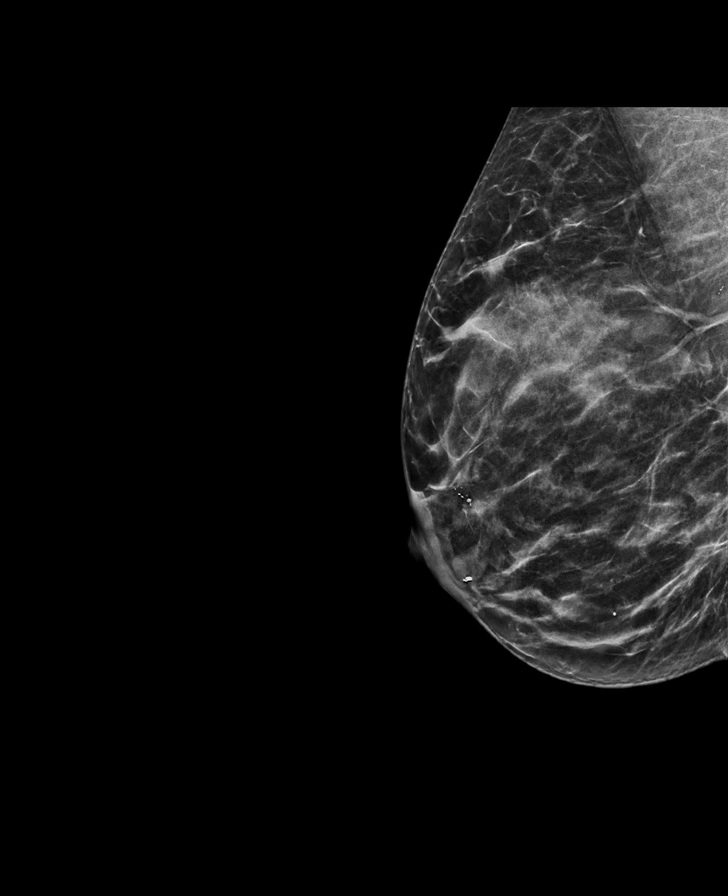

[L CC synth-2D]
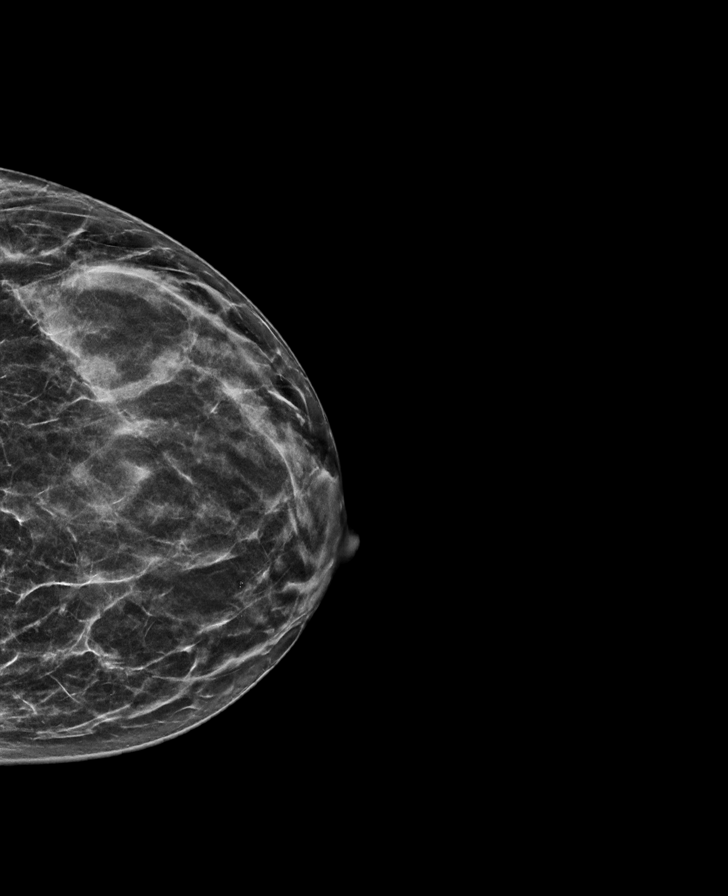

[L MLO synth-2D]
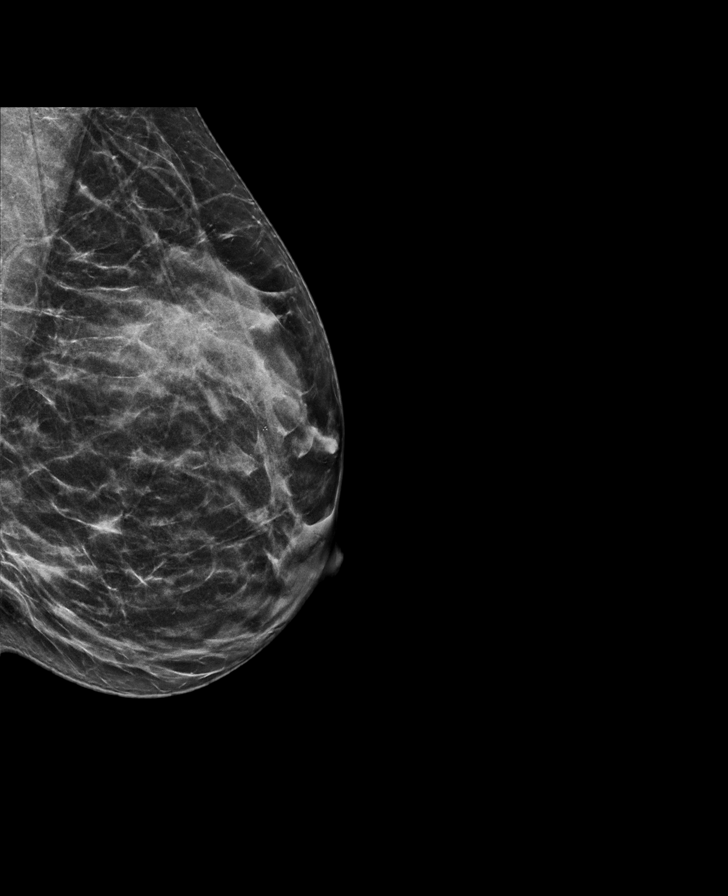

[R CC tomo · tomo slice 27/53.0]
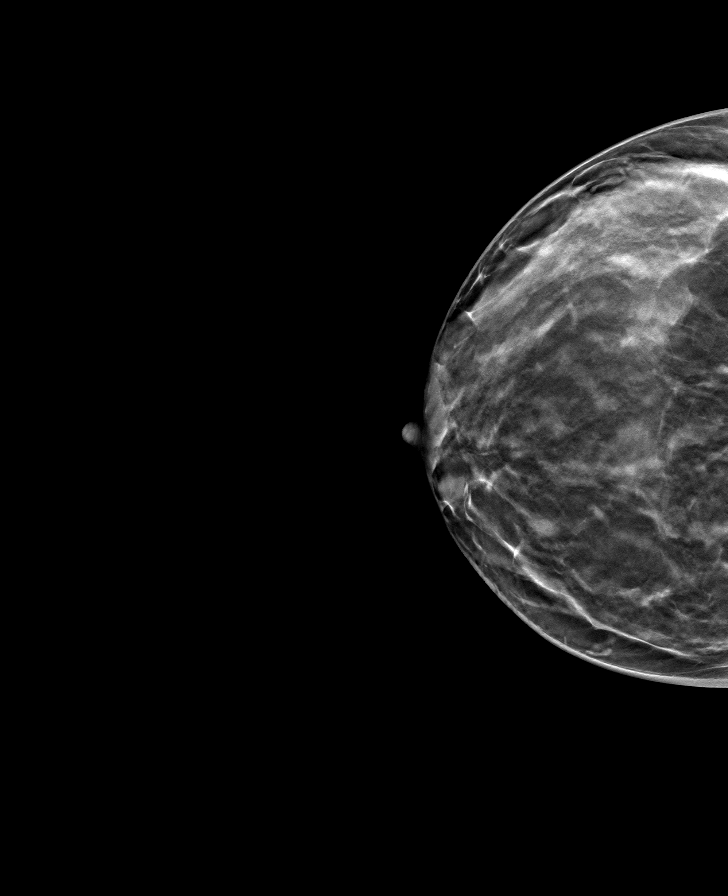

[R MLO tomo · tomo slice 30/59.0]
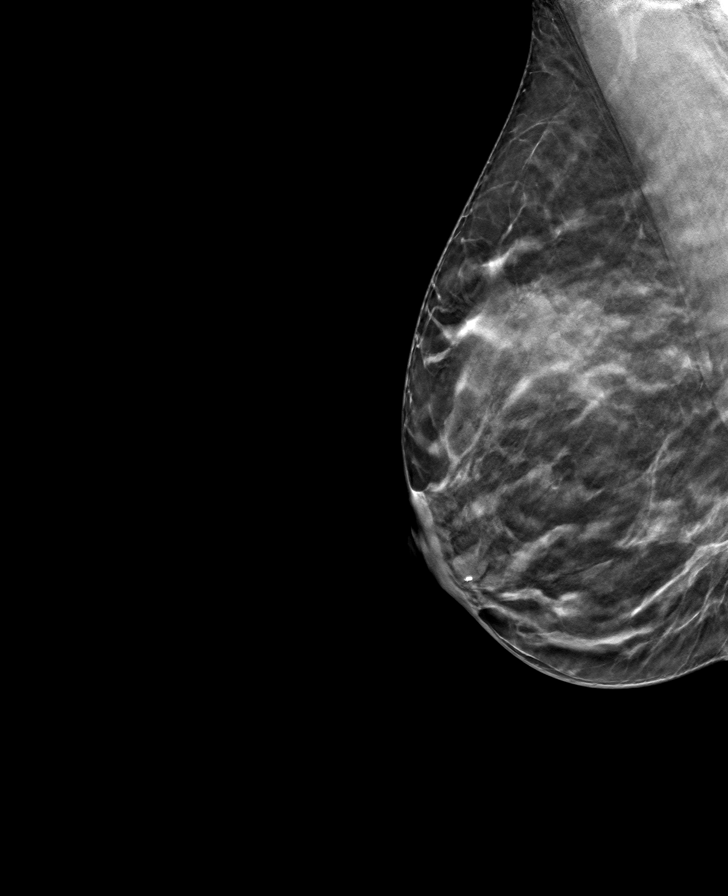

[L CC tomo · tomo slice 29/57.0]
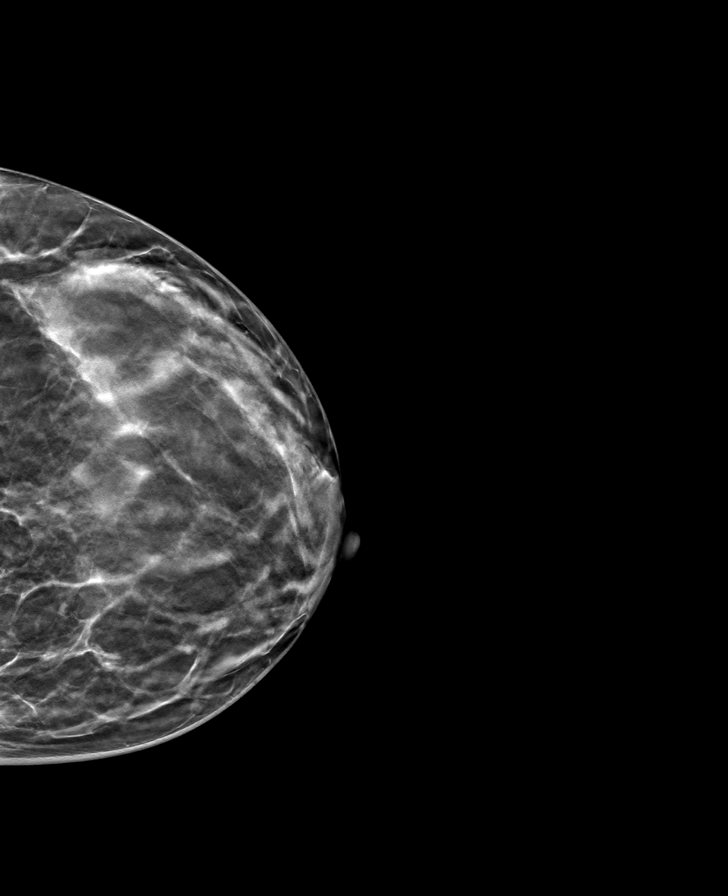

[L MLO tomo · tomo slice 30/59.0]
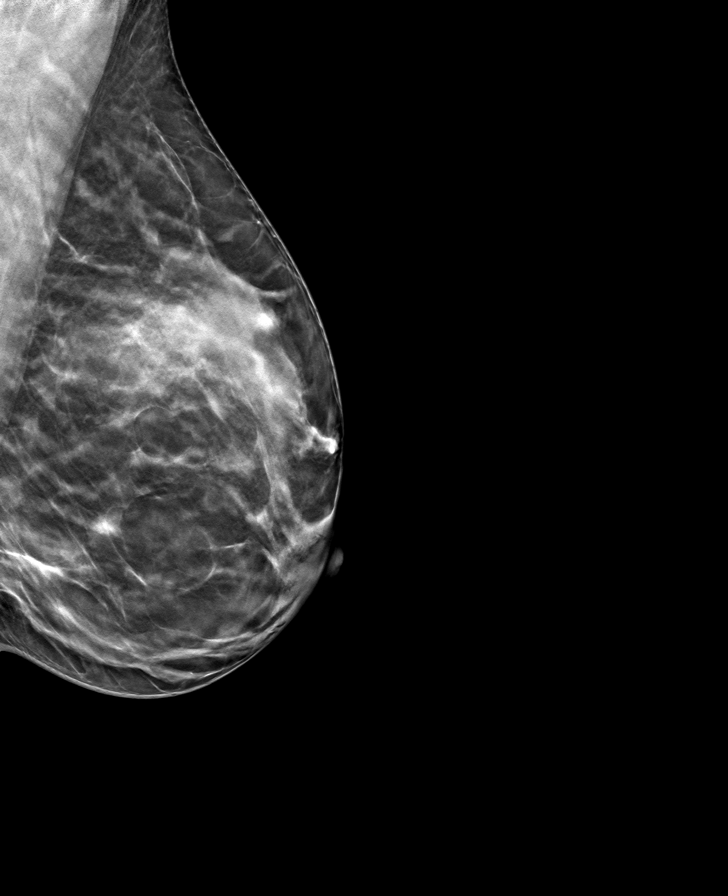

[8 of 24 positions shown; findings below may reference images not displayed]

ACR Breast Density Category c: The breast tissue is heterogeneously
dense, which may obscure small masses.
FINDINGS: There are no findings suspicious for malignancy. Images were
processed with CAD.
IMPRESSION: No mammographic evidence of malignancy. A result letter of this
screening mammogram will be mailed directly to the patient.

RECOMMENDATION:
Screening mammogram in one year. (Code:FT-U-LHB)

BI-RADS CATEGORY  1: Negative.

## 2020-03-14 ENCOUNTER — Ambulatory Visit (HOSPITAL_COMMUNITY)
Admission: EM | Admit: 2020-03-14 | Discharge: 2020-03-14 | Disposition: A | Discharge status: Home / Self Care | Payer: BLUE CROSS/BLUE SHIELD | Attending: Family Medicine | Admitting: Family Medicine

## 2020-03-14 ENCOUNTER — Encounter (HOSPITAL_COMMUNITY): Payer: Self-pay | Admitting: *Deleted

## 2020-03-14 ENCOUNTER — Ambulatory Visit (INDEPENDENT_AMBULATORY_CARE_PROVIDER_SITE_OTHER): Payer: BLUE CROSS/BLUE SHIELD

## 2020-03-14 DIAGNOSIS — R238 Other skin changes: Secondary | ICD-10-CM | POA: Diagnosis not present

## 2020-03-14 DIAGNOSIS — S6992XA Unspecified injury of left wrist, hand and finger(s), initial encounter: Secondary | ICD-10-CM | POA: Diagnosis not present

## 2020-03-14 DIAGNOSIS — M79642 Pain in left hand: Secondary | ICD-10-CM

## 2020-03-14 NOTE — ED Triage Notes (Signed)
Patient reports slamming left hand in door on Monday. States that she had swelling and discoloration. Most of that has decreased. States is still having pain. No deformity noted. CMS intact. Pain does radiate up arm.   Patient points to anterior aspect of palm at pink and ring finger.

## 2020-03-14 NOTE — Discharge Instructions (Addendum)
Take ibuprofen as needed.  Rest and elevate your hand.  Apply ice packs 2-3 times a day for up to 20 minutes each.    Follow up with your primary care provider or an orthopedist if you symptoms continue or worsen;  Or if you develop new symptoms, such as numbness, tingling, or weakness.

## 2020-03-14 NOTE — ED Provider Notes (Signed)
[place]   [id number] 03/14/20 Arrival Time: 1159  GL:OVFIE PAIN  SUBJECTIVE: History from: patient. Christine Lara is a 44 y.o. female complains of left hand pain that began about 4 days ago.  She reports that she shut her hand in a car door.  Reports that she still has bruising, limited mobility, and pain.  Has been taking ibuprofen with some relief.  Has been using ice as well with some relief. Symptoms are made worse with activity.  Denies similar symptoms in the past.  Denies fever, chills, erythema, ecchymosis, effusion, weakness, numbness and tingling, saddle paresthesias, loss of bowel or bladder function.      ROS: As per HPI.  All other pertinent ROS negative.     Past Medical History:  Diagnosis Date  . Fibroids   . Hx of wisdom tooth extraction    History reviewed. No pertinent surgical history. Allergies  Allergen Reactions  . Shrimp [Shellfish Allergy]     Internal ears itching   No current facility-administered medications on file prior to encounter.   Current Outpatient Medications on File Prior to Encounter  Medication Sig Dispense Refill  . cetirizine (ZYRTEC) 10 MG tablet Take 10 mg by mouth daily.    . clindamycin (CLEOCIN T) 1 % lotion Apply topically 2 (two) times daily.    . montelukast (SINGULAIR) 10 MG tablet Take 10 mg by mouth at bedtime.    Marland Kitchen spironolactone (ALDACTONE) 25 MG tablet Take 25 mg by mouth daily.     Social History   Socioeconomic History  . Marital status: Single    Spouse name: Not on file  . Number of children: Not on file  . Years of education: Not on file  . Highest education level: Not on file  Occupational History  . Not on file  Tobacco Use  . Smoking status: Never Smoker  . Smokeless tobacco: Never Used  Vaping Use  . Vaping Use: Never used  Substance and Sexual Activity  . Alcohol use: No  . Drug use: No  . Sexual activity: Yes    Partners: Male    Comment: 1st intercourse- 73, partners- 33, boyfriend-  4 yrs   Other Topics Concern  . Not on file  Social History Narrative  . Not on file   Social Determinants of Health   Financial Resource Strain:   . Difficulty of Paying Living Expenses: Not on file  Food Insecurity:   . Worried About Charity fundraiser in the Last Year: Not on file  . Ran Out of Food in the Last Year: Not on file  Transportation Needs:   . Lack of Transportation (Medical): Not on file  . Lack of Transportation (Non-Medical): Not on file  Physical Activity:   . Days of Exercise per Week: Not on file  . Minutes of Exercise per Session: Not on file  Stress:   . Feeling of Stress : Not on file  Social Connections:   . Frequency of Communication with Friends and Family: Not on file  . Frequency of Social Gatherings with Friends and Family: Not on file  . Attends Religious Services: Not on file  . Active Member of Clubs or Organizations: Not on file  . Attends Archivist Meetings: Not on file  . Marital Status: Not on file  Intimate Partner Violence:   . Fear of Current or Ex-Partner: Not on file  . Emotionally Abused: Not on file  . Physically Abused: Not on file  .  Sexually Abused: Not on file   Family History  Problem Relation Age of Onset  . Breast cancer Paternal Aunt   . Breast cancer Paternal Grandmother   . Diabetes Paternal Grandmother   . Cancer Mother        thyroid  . Breast cancer Mother     OBJECTIVE:  Vitals:   03/14/20 1329  BP: 121/78  Pulse: 71  Resp: 17  Temp: 98 F (36.7 C)  TempSrc: Oral  SpO2: 100%    General appearance: ALERT; in no acute distress.  Head: NCAT Lungs: Normal respiratory effort CV:  pulses 2+ bilaterally. Cap refill < 2 seconds Musculoskeletal:  Inspection: Skin warm, dry, clear and intact without obvious erythema, effusion, or ecchymosis.  Palpation: Nontender to palpation ROM: FROM active and passive Skin: warm and dry Neurologic: Ambulates without difficulty; Sensation intact about the  upper/ lower extremities Psychological: alert and cooperative; normal mood and affect  DIAGNOSTIC STUDIES:  No results found.   ASSESSMENT & PLAN:  1. Left hand pain     X-ray negative Continue conservative management of rest, ice, and gentle stretches Take ibuprofen as needed for pain relief (may cause abdominal discomfort, ulcers, and GI bleeds avoid taking with other NSAIDs)  Follow up with PCP if symptoms persist Return or go to the ER if you have any new or worsening symptoms (fever, chills, chest pain, abdominal pain, changes in bowel or bladder habits, pain radiating into lower legs)   Reviewed expectations re: course of current medical issues. Questions answered. Outlined signs and symptoms indicating need for more acute intervention. Patient verbalized understanding. After Visit Summary given.       Faustino Congress, NP 03/14/20 1452

## 2021-08-12 ENCOUNTER — Ambulatory Visit (INDEPENDENT_AMBULATORY_CARE_PROVIDER_SITE_OTHER): Payer: BLUE CROSS/BLUE SHIELD

## 2021-08-12 ENCOUNTER — Encounter (HOSPITAL_COMMUNITY): Payer: Self-pay

## 2021-08-12 ENCOUNTER — Other Ambulatory Visit: Payer: Self-pay

## 2021-08-12 ENCOUNTER — Ambulatory Visit (HOSPITAL_COMMUNITY)
Admission: EM | Admit: 2021-08-12 | Discharge: 2021-08-12 | Disposition: A | Discharge status: Home / Self Care | Payer: BLUE CROSS/BLUE SHIELD | Attending: Family Medicine | Admitting: Family Medicine

## 2021-08-12 DIAGNOSIS — L720 Epidermal cyst: Secondary | ICD-10-CM | POA: Diagnosis not present

## 2021-08-12 DIAGNOSIS — R059 Cough, unspecified: Secondary | ICD-10-CM

## 2021-08-12 DIAGNOSIS — R058 Other specified cough: Secondary | ICD-10-CM

## 2021-08-12 MED ORDER — DOXYCYCLINE HYCLATE 100 MG PO CAPS: 100.0000 mg | ORAL_CAPSULE | Freq: Two times a day (BID) | ORAL | 0 refills | Status: AC

## 2021-08-12 MED ORDER — BENZONATATE 100 MG PO CAPS: ORAL_CAPSULE | ORAL | 0 refills | Status: AC

## 2021-08-12 NOTE — ED Triage Notes (Signed)
Pt presents with c/o a chronic cough and boil on the back of her neck.

## 2021-08-12 NOTE — ED Provider Notes (Signed)
[place]   [id number] 08/12/21 Arrival Time: 1275  ASSESSMENT & PLAN:  1. Post-viral cough syndrome   2. Epidermoid cyst of skin    I have personally viewed the imaging studies ordered this visit. No acute changes on CXR. Discussed post-viral coughing.  Incision and Drainage Procedure Note  Anesthesia: PainEaze spray  Procedure Details  The procedure, risks and complications have been discussed in detail (including, but not limited to pain and bleeding) with the patient.  The skin induration was prepped and draped in the usual fashion. After adequate local anesthesia, I&D with a #11 blade was performed on the LEFT post-auricular neck with purulent drainage.  EBL: minimal Drains: none Packing: N/A Condition: Tolerated procedure well Complications: none.  Begin: Meds ordered this encounter  Medications   benzonatate (TESSALON) 100 MG capsule    Sig: Take 1 capsule by mouth every 8 (eight) hours for cough.    Dispense:  21 capsule    Refill:  0   doxycycline (VIBRAMYCIN) 100 MG capsule    Sig: Take 1 capsule (100 mg total) by mouth 2 (two) times daily.    Dispense:  14 capsule    Refill:  0    Follow-up Information     Hayden Rasmussen, MD.   Specialty: Family Medicine Why: As needed. Contact information: [place] [address] [phone]                  Reviewed expectations re: course of current medical issues. Questions answered. Outlined signs and symptoms indicating need for more acute intervention. Patient verbalized understanding. After Visit Summary given.   SUBJECTIVE:  Christine Lara is a 46 y.o. female who reports continued coughing; URI symptoms approx 2 w ago; cough is dry; no SOB reported. Afebrile. Also reports "bump" behind L ear; sev days. No drainage; is painful.   OBJECTIVE:  Vitals:   08/12/21 1006  BP: 138/82  Pulse: 86  Resp: 18  Temp: 98.4 F (36.9 C)  TempSrc: Oral   SpO2: 99%     General appearance: alert; no distress Head/Neck: approx 1 cm induration of her post-auricular neck; tender to touch; no active drainage or bleeding Resp: CTAB; unlabored Psychological: alert and cooperative; normal mood and affect  Allergies  Allergen Reactions   Shrimp [Shellfish Allergy]     Internal ears itching    Past Medical History:  Diagnosis Date   Fibroids    Hx of wisdom tooth extraction    Social History   Socioeconomic History   Marital status: Single    Spouse name: Not on file   Number of children: Not on file   Years of education: Not on file   Highest education level: Not on file  Occupational History   Not on file  Tobacco Use   Smoking status: Never   Smokeless tobacco: Never  Vaping Use   Vaping Use: Never used  Substance and Sexual Activity   Alcohol use: No   Drug use: No   Sexual activity: Yes    Partners: Male    Comment: 1st intercourse- 66, partners- 61, boyfriend- 4 yrs   Other Topics Concern   Not on file  Social History Narrative   Not on file   Social Determinants of Health   Financial Resource Strain: Not on file  Food Insecurity: Not on file  Transportation Needs: Not on file  Physical Activity: Not on file  Stress: Not on file  Social Connections: Not  on file   Family History  Problem Relation Age of Onset   Breast cancer Paternal 40    Breast cancer Paternal Grandmother    Diabetes Paternal Grandmother    Cancer Mother        thyroid   Breast cancer Mother    History reviewed. No pertinent surgical history.          Vanessa Kick, MD 08/12/21 1620

## 2023-02-08 LAB — AMB RESULTS CONSOLE CBG: Glucose: 97

## 2023-02-08 NOTE — Progress Notes (Signed)
Pt  has PCP. No SDOH needs at this time.  

## 2023-03-02 NOTE — Progress Notes (Signed)
The patient attended 02/08/23 screening event where her blood glucose screening results were wnl and her BP was 132/89. At the event the patient noted she had a PCP, insurance and no SDOH insecurities. Per chart review the PCP is aware of the elevated BP. Pt has seen PCP in the last 12 months. Chart review also indicates a gastro future appt. No additional Health equity team support indicated at this time.

## 2023-07-24 NOTE — H&P (Signed)
 Gastroenterology Preprocedural History and Physical     Chief Complaint/Reason for Procedure: Christine Lara is a 47 y.o. female scheduled for a Colonoscopy, for the following indication Colon Cancer Screening using moderate sedation as per standardized order set .  A History and Physical has been performed and patient medication allergies have been reviewed. The patient's tolerance of previous anesthesia has been reviewed. The risks and benefits of the procedure and the sedation options and risks were discussed with the patient. All questions were answered and informed consent obtained.  HPI  Past Medical History:  Diagnosis Date  . Fibroid     Past Surgical History:  Procedure Laterality Date  . BREAST BIOPSY Right 02/24/2016   Procedure: BREAST BIOPSY; bg fibroadenoma    Family History  Problem Relation Name Age of Onset  . Heart disease Mother    . Thyroid disease Mother    . Breast cancer Mother  89  . Breast cancer Paternal Aunt         Dx. 27s  . Lung cancer Paternal Aunt    . Lymphoma Maternal Grandmother  26  . Lung cancer Maternal Grandmother  53  . Breast cancer Paternal Grandmother         Dx. 40  . Ovarian cancer Other Pat Great Aunt   . Colon cancer Neg Hx      Social History   Socioeconomic History  . Marital status: Single    Spouse name: Not on file  . Number of children: Not on file  . Years of education: Not on file  . Highest education level: Not on file  Occupational History  . Not on file  Tobacco Use  . Smoking status: Never  . Smokeless tobacco: Never  Substance and Sexual Activity  . Alcohol use: Not Currently  . Drug use: Never  . Sexual activity: Yes    Partners: Male    Birth control/protection: OCP  Other Topics Concern  . Not on file  Social History Narrative  . Not on file   Social Drivers of Health   Food Insecurity: No Food Insecurity (02/08/2023)   [facility]   Food vital sign   . Within the past 12  months, you worried that your food would run out before you got money to buy more: Never true   . Within the past 12 months, the food you bought just didn't last and you didn't have money to get more: Never true  Transportation Needs: No Transportation Needs (02/08/2023)   Received from [facility] - Transportation   . Lack of Transportation (Medical): No   . Lack of Transportation (Non-Medical): No  Safety: Not At Risk (02/08/2023)   Received from [facility]   Safety   . Within the last year, have you been afraid of your partner or ex-partner?: No   . Within the last year, have you been humiliated or emotionally abused in other ways by your partner or ex-partner?: No   . Within the last year, have you been kicked, hit, slapped, or otherwise physically hurt by your partner or ex-partner?: No   . Within the last year, have you been raped or forced to have any kind of sexual activity by your partner or ex-partner?: No  Living Situation: Not on file    Meds Ordered in [place]  Medication Sig Dispense Refill  . ferrous sulfate 325 mg (65 mg iron) tablet TAKE 1 TABLET BY MOUTH ONCE DAILY AT LUNCHTIME  90    . norgestrel-ethinyl estradioL (Low-Ogestrel, 28,) 0.3-30 mg-mcg per tablet Take 1 tablet by mouth daily. 90 tablet 3   No current Epic-ordered facility-administered medications on file.    @HOMEMEDS @  Allergies  Allergen Reactions  . Shellfish Containing Products Itching    Internal ears itching      Physical Exam:  There were no vitals filed for this visit. There is no height or weight on file to calculate BMI.  Airway:  MALLAMPATI TWO   Heart:  normal S1 and S2 Lungs:  clear Abdomen:  soft, nontender, normal bowel sounds Mental Status:  awake and alert; oriented to person, place, and time     ASA Grade Assessment: ASA 2 - Patient with mild systemic disease with no functional limitations   I have reviewed patient's health history and patient is cleared to  proceed with the proposed procedure at this facility.

## 2024-01-16 NOTE — Progress Notes (Signed)
 CC: Breast and Pelvic Exam    Chief Complaint  Patient presents with  . Annual Exam    PAP: 01/10/2023 nl LMP: 01/01/24 BCM: OCPs MMG: 01/16/24 appt     Subjective:   HPI: Christine Lara is a 48 y.o. y/o G3P1021 who presents today for annual exam Patient's last menstrual period was 01/01/2024 (approximate).   Patient with a history of uterine fibroids complains of pelvic pressure and heaviness.  Patient currently taking oral contraceptives. she states her cycles have not changed from last year and she has a mild to moderate amount of bleeding when she has a period.  Occasionally has a heavy cycle and is not taking oral contraceptives.  She denies any bladder complaints. She does not have any urgency, frequency, hematuria, and stress urinary incontinence. She denies any bowel complaints. She denies any vaginal discharge. Patient denies any breast pains or masses.  She is sexually active without any problems and denies dyspareunia  or change in desire.  ______________________________________________________________________  Surgical History[1]  Allergies[2]  Medical History[3]  OB History  Gravida Para Term Preterm AB Living  3 1 1  0 2 1  SAB IAB Ectopic Molar Multiple Live Births  0 0 0 0  1    # Outcome Date GA Lbr Len/2nd Weight Sex Type Anes PTL Lv  3 Term 69    F NSVD   LIV  2 AB           1 AB               Family History[4]  Social History   Socioeconomic History  . Marital status: Single    Spouse name: Not on file  . Number of children: Not on file  . Years of education: Not on file  . Highest education level: Not on file  Occupational History  . Not on file  Tobacco Use  . Smoking status: Never  . Smokeless tobacco: Never  Substance and Sexual Activity  . Alcohol use: Not Currently  . Drug use: Never  . Sexual activity: Yes    Partners: Male    Birth control/protection: OCP  Other Topics Concern  . Not on file  Social History Narrative  . Not  on file   Social Drivers of Health   Food Insecurity: No Food Insecurity (02/08/2023)   Received from [facility]   Food vital sign   . Within the past 12 months, you worried that your food would run out before you got money to buy more: Never true   . Within the past 12 months, the food you bought just didn't last and you didn't have money to get more: Never true  Transportation Needs: No Transportation Needs (02/08/2023)   Received from [facility] - Transportation   . Lack of Transportation (Medical): No   . Lack of Transportation (Non-Medical): No  Safety: Not At Risk (02/08/2023)   Received from [facility]   Safety   . Within the last year, have you been afraid of your partner or ex-partner?: No   . Within the last year, have you been humiliated or emotionally abused in other ways by your partner or ex-partner?: No   . Within the last year, have you been kicked, hit, slapped, or otherwise physically hurt by your partner or ex-partner?: No   . Within the last year, have you been raped or forced to have any kind of sexual activity by your partner or ex-partner?: No  Living Situation: Not on file    Current Medications[5]     Personal info:     One child-gave up for adoption, dating, working      Review Of System:  General: no fever, fatigue, weight or appetite changes Skin: no rashes, lesions, itching, mole change HEENT: no frequent headaches, hearing changes, sore throat, dental problems, sinus problems   Lymph: no tender or swollen lymph nodes  Breasts: no lumps, nipple discharge, breast pain Resp: no SOB, cough, wheezing Cardio: no chest pain, palpitations, edema GI: no N/V/D, constipation, anorexia, bloating, reflux, blood in stool, leakage of stool  GU: no frequency, dysuria, flank pain, hematuria, incontinence, retention       GYN: no change in discharge or odor, itching, inter-menstrual or post-coital bleeding, dyspareunia  Musk: no weakness, arthralgia,  joint swelling, ROM limitations Endocrine: no polydypsia, polyphagia, polyuria, heat/cold intolerance, hot flashes, dry skin Psych: no depression, anxiety, panic attacks  RECENT LABS: Results for orders placed or performed in visit on 01/10/23  Human Papilloma Virus (HPV) HR With 16/18 Genotype   Collection Time: 01/10/23  2:16 PM  Result Value Ref Range   HPV 16 Negative Negative   HPV 18 Negative Negative   HPV Other High Risk Negative Display   Methodology    Pap Smear, Liquid Based   Collection Time: 01/10/23  2:16 PM  Result Value Ref Range   Case Report      Gynecologic Cytology Report                       Case: [id number]                               Authorizing Provider:  Karole LOISE Blanch, MD         Collected:           01/10/2023 1416             Ordering Location:     [facility]  Received:            01/10/2023 1416                                    [facility]                                                                            [facility]                                                         First Screen:          Aldona Simpers  Pathologist:           Will Malm Croitoru,                                                                           MD                                                                          Specimen:    Pap Liquid-Based Prep, Cervix                                                            Pap Interpretation Negative for intraepithelial lesion or malignancy     Other Findings Reactive cellular changes    Specimen Adequacy      Satisfactory for evaluation, endocervical/transformation zone component present   Additional Information      The Pap test is a screening test used as an aid in detecting cervical cancer and its precursors.  Published data indicates that Pap testing inherently involves both false-negative and false-positive  results.  Pap tests are NOT diagnostic procedures and should not be used exclusively for the detection of cervical cancer.  Periodic repeat testing with clinical correlation and follow-up of unexplained clinical signs and symptoms is recommended.    Pap HPV      CoTest (recommended for screening for ages 52-29 years of age)   Pap Gyn History      No pertinent history    Attestation Statement      I verify that I have personally reviewed all relevant slides/materials for this case and rendered or confirmed the diagnosis.      Objective:   PHYSICAL EXAM: BP 135/79   Ht 1.626 m (5' 4)   Wt 70.3 kg (155 lb)   LMP 01/01/2024 (Approximate)   BMI 26.61 kg/m  Constitutional: Well-developed, well-nourished female in no acute distress Neurological: Alert and oriented to person, place, and time, affect appropriate Psychiatric: Mood and affect appropriate Skin: No rashes or lesions, No suspicious lesions, masses, or ulcers.  Neck: Supple without masses. Trachea is midline.Thyroid is normal size without masses, carotid pulses normal and no bruits Lymphatics: No cervical, axillary, supraclavicular, or inguinal adenopathy noted Respiratory: Clear to auscultation bilaterally. Good air movement with normal work of  breathing. Cardiovascular: Regular rate and rhythm. Extremities grossly normal, nontender with no edema; pulses regular Gastrointestinal: Soft, nontender, nondistended. No masses or hernias appreciated. No hepatosplenomegaly. No fluid wave. No rebound or guarding. Breast Exam: Appear normal and symmetric without palpable masses, skin changes or nipple inversion.  No discharge, rash, or skin retraction. No palpable lymph nodes. No tenderness, masses, or nipple abnormality Genitourinary:         External Genitalia: Normal female genitalia w/o masses, lesions, rashes  Urethral Meatus: Normal caliber and position    Urethra: Midline, no masses    Bladder: Well-suspended, NT    Vagina:  mucosa pink and moist without lesions or ulcers     Cervix: No lesions, normal size and consistency; no cervical motion tenderness     Uterus: 18 to 19 weeks size uterus (15 weeks size last yr)  adnexa/Parametria: No masses; no parametrial nodularity; no tenderness Perineum/Anus: No lesions, no hemorids, normal specter tone   Chaperone present during exam.    Assessment/Plan:  Christine Lara is a 48 y.o. female [phone] who presents for annual exam, symptomatic fibroid uterus     Pt will follow in 1 year for a physical exam and knows the importance of getting her routine screening labs and procedures.  Calcium supplementation, STD prevention, importance of avoiding cigarettes, avoiding a high fat diet and exercise were discussed with the patient.  She was told that she needed to do monthly breast exams, get a mammogram yearly starting at age 38, get a lipid profile every 3-5 years, and needed a colonoscopy at 50.  >  Pap smear:  nl in 2024  Sono witrh apt  Risk/Benefits of doing hysterectomy explained the patient.  Patient explained the risk of leiomyosarcoma with uterine fibroids.      [1] Past Surgical History: Procedure Laterality Date  . BREAST BIOPSY Right 02/24/2016   Procedure: BREAST BIOPSY; bg fibroadenoma  [2] Allergies Allergen Reactions  . Shellfish Containing Products Itching    Internal ears itching  [3] Past Medical History: Diagnosis Date  . Anemia   . Fibroid   [4] Family History Problem Relation Name Age of Onset  . Heart disease Mother    . Thyroid disease Mother    . Breast cancer Mother  64  . Breast cancer Maternal Aunt    . Breast cancer Paternal Aunt         Dx. 50s  . Lung cancer Paternal Aunt    . Lymphoma Maternal Grandmother  11  . Lung cancer Maternal Grandmother  70  . Breast cancer Paternal Grandmother         Dx. 40  . Ovarian cancer Other Pat Great Aunt   . Colon cancer Neg Hx    [5]  Current Outpatient Medications:  .   ferrous sulfate 325 mg (65 mg iron) tablet, TAKE 1 TABLET BY MOUTH ONCE DAILY AT LUNCHTIME 90, Disp: , Rfl:  .  norgestrel-ethinyl estradioL (Low-Ogestrel, 28,) 0.3-30 mg-mcg per tablet, Take 1 tablet by mouth daily., Disp: 90 tablet, Rfl: 3
